# Patient Record
Sex: Female | Born: 1990
Health system: Southern US, Community
[De-identification: ages and names within clinical notes are randomized; demographics above are authoritative.]

## PROBLEM LIST (undated history)

## (undated) DIAGNOSIS — F32A Depression, unspecified: Secondary | ICD-10-CM

## (undated) DIAGNOSIS — R7303 Prediabetes: Secondary | ICD-10-CM

## (undated) DIAGNOSIS — N979 Female infertility, unspecified: Secondary | ICD-10-CM

## (undated) DIAGNOSIS — F329 Major depressive disorder, single episode, unspecified: Secondary | ICD-10-CM

## (undated) DIAGNOSIS — E282 Polycystic ovarian syndrome: Secondary | ICD-10-CM

## (undated) DIAGNOSIS — M549 Dorsalgia, unspecified: Secondary | ICD-10-CM

## (undated) DIAGNOSIS — E079 Disorder of thyroid, unspecified: Secondary | ICD-10-CM

## (undated) DIAGNOSIS — E039 Hypothyroidism, unspecified: Secondary | ICD-10-CM

## (undated) DIAGNOSIS — J45909 Unspecified asthma, uncomplicated: Secondary | ICD-10-CM

## (undated) DIAGNOSIS — Z8619 Personal history of other infectious and parasitic diseases: Secondary | ICD-10-CM

## (undated) DIAGNOSIS — K589 Irritable bowel syndrome without diarrhea: Secondary | ICD-10-CM

## (undated) DIAGNOSIS — F419 Anxiety disorder, unspecified: Secondary | ICD-10-CM

## (undated) HISTORY — DX: Irritable bowel syndrome, unspecified: K58.9

## (undated) HISTORY — DX: Disorder of thyroid, unspecified: E07.9

## (undated) HISTORY — DX: Depression, unspecified: F32.A

## (undated) HISTORY — PX: PLANTAR FASCIA RELEASE: SHX2239

## (undated) HISTORY — DX: Prediabetes: R73.03

## (undated) HISTORY — DX: Unspecified asthma, uncomplicated: J45.909

## (undated) HISTORY — DX: Hypothyroidism, unspecified: E03.9

## (undated) HISTORY — DX: Dorsalgia, unspecified: M54.9

## (undated) HISTORY — DX: Polycystic ovarian syndrome: E28.2

## (undated) HISTORY — DX: Personal history of other infectious and parasitic diseases: Z86.19

## (undated) HISTORY — DX: Female infertility, unspecified: N97.9

## (undated) HISTORY — DX: Anxiety disorder, unspecified: F41.9

---
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
## 2009-11-09 ENCOUNTER — Emergency Department (HOSPITAL_COMMUNITY): Admission: EM | Admit: 2009-11-09 | Discharge: 2009-11-09 | Payer: Self-pay | Admitting: Family Medicine

## 2010-08-05 ENCOUNTER — Encounter: Payer: Self-pay | Admitting: Internal Medicine

## 2010-08-05 ENCOUNTER — Ambulatory Visit (INDEPENDENT_AMBULATORY_CARE_PROVIDER_SITE_OTHER): Payer: Managed Care, Other (non HMO) | Admitting: Internal Medicine

## 2010-08-05 VITALS — BP 120/80 | HR 66 | Ht 69.75 in | Wt 277.0 lb

## 2010-08-05 DIAGNOSIS — R5381 Other malaise: Secondary | ICD-10-CM

## 2010-08-05 DIAGNOSIS — R5383 Other fatigue: Secondary | ICD-10-CM | POA: Insufficient documentation

## 2010-08-05 DIAGNOSIS — E669 Obesity, unspecified: Secondary | ICD-10-CM | POA: Insufficient documentation

## 2010-08-05 DIAGNOSIS — Z113 Encounter for screening for infections with a predominantly sexual mode of transmission: Secondary | ICD-10-CM | POA: Insufficient documentation

## 2010-08-05 DIAGNOSIS — E039 Hypothyroidism, unspecified: Secondary | ICD-10-CM

## 2010-08-05 DIAGNOSIS — E079 Disorder of thyroid, unspecified: Secondary | ICD-10-CM | POA: Insufficient documentation

## 2010-08-05 DIAGNOSIS — Z23 Encounter for immunization: Secondary | ICD-10-CM

## 2010-08-05 LAB — CBC WITH DIFFERENTIAL/PLATELET
Basophils Absolute: 0 10*3/uL (ref 0.0–0.1)
HCT: 43.4 % (ref 36.0–46.0)
Lymphs Abs: 2.9 10*3/uL (ref 0.7–4.0)
MCV: 90.8 fl (ref 78.0–100.0)
Monocytes Absolute: 0.6 10*3/uL (ref 0.1–1.0)
Platelets: 290 10*3/uL (ref 150.0–400.0)
RDW: 13.1 % (ref 11.5–14.6)

## 2010-08-05 LAB — HEPATIC FUNCTION PANEL
ALT: 25 U/L (ref 0–35)
Alkaline Phosphatase: 62 U/L (ref 39–117)
Bilirubin, Direct: 0.1 mg/dL (ref 0.0–0.3)
Total Protein: 7.5 g/dL (ref 6.0–8.3)

## 2010-08-05 LAB — TSH: TSH: 7.34 u[IU]/mL — ABNORMAL HIGH (ref 0.35–5.50)

## 2010-08-05 LAB — BASIC METABOLIC PANEL
BUN: 9 mg/dL (ref 6–23)
Creatinine, Ser: 0.8 mg/dL (ref 0.4–1.2)
GFR: 95.78 mL/min (ref >60.00)

## 2010-08-05 LAB — LIPID PANEL
Cholesterol: 157 mg/dL (ref 0–200)
LDL Cholesterol: 101 mg/dL — ABNORMAL HIGH (ref 0–99)

## 2010-08-05 NOTE — Patient Instructions (Addendum)
Will notify you  of labs when available. Then plan follow up and  Med dosing.  Consider weight watchers type intervention. Avoid eating out and sugar drinks   Some caffiene ok.

## 2010-08-05 NOTE — Progress Notes (Signed)
  Subjective:    Patient ID: Sara Wallace, female    DOB: [birth year], 20 y.o.   MRN: [id number]  HPI Patient comes in today for a first-time visit. She is here to establish and followup for fatigue thyroid problem as her previous primary care physician has moved. Onset of thyroid condition in 2009 NS TOOK  Up to 6 months ago and then forgot   To take.    Saw Dr Larina Bras about 2010  . Initial   Dx was tired and weight gain.  Tired and irritable. when goes off.  Gyne Silva  No problems.   Last blood test for thyroid  Was about a year ago.   Not too bad. Was on 50 mcg  Always struggles with weight asks about Optifast flier that she is seen. Has not gone to Weight Watchers because of the money investments.  Graduated high school   Attends [employer]  Nursing  12 hours  To go for  bsn.    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   Sleep 9 hours.   No wakenings.    Recent   no t rested.   No  Snoring sleep apnea. Some activity.      Review of Systems Negative for hearing vision chest pain shortness of breath unusual bleeding depression panic. Nausea vomiting diarrhea rest negative or as per history of present illness    Objective:   Physical Exam Physical Exam: Vital signs reviewed WUJ:WJXB is a well-developed well-nourished alert cooperative  white female who appears her stated age in no acute distress.  HEENT: normocephalic  traumatic , Eyes: PERRL EOM's full, conjunctiva clear, Nares: paten,t no deformity discharge or tenderness., Ears: no deformity EAC's clear TMs with normal landmarks. Mouth: clear OP, no lesions, edema.  Moist mucous membranes. Dentition in adequate repair. NECK: supple without masses, thyromegaly or bruits. CHEST/PULM:  Clear to auscultation and percussion breath sounds equal no wheeze , rales or rhonchi. No chest wall deformities or tenderness. CV: PMI is nondisplaced, S1 S2 no gallops, murmurs, rubs. Peripheral pulses are full without delay.No JVD .  ABDOMEN: Bowel sounds normal  nontender  No guard or rebound, no hepato splenomegal no CVA tenderness.  No hernia. Extremtities:  No clubbing cyanosis or edema, no acute joint swelling or redness no focal atrophy NEURO:  Oriented x3, cranial nerves 3-12 appear to be intact, no obvious focal weakness,gait within normal limits no abnormal reflexes or asymmetrical SKIN: No acute rashes normal turgor, color, no bruising or petechiae.  tatoo and piercing s PSYCH: Oriented, good eye contact, no obvious depression anxiety, cognition and judgment appear normal.     Assessment & Plan:  Hypothyroid dx  Off med for 6 months   Disc compliance   adherance  Obesity Fatigue   Fam hx of dm  father No obvious osa    Condition counseling about weight control nutrition and healthy lifestyle. We'll get laboratory studies followup is appropriate but lifestyle intervention may be the best that for her. Caution with diet such as Optifast as it is very low-calorie cost money in the maintenance is often unsuccessful. Would suggest more of a lifestyle change plans such as Weight Watchers.

## 2010-08-06 LAB — RPR

## 2010-08-07 ENCOUNTER — Ambulatory Visit: Payer: Self-pay | Admitting: Internal Medicine

## 2010-08-10 ENCOUNTER — Telehealth: Payer: Self-pay | Admitting: *Deleted

## 2010-08-10 DIAGNOSIS — E039 Hypothyroidism, unspecified: Secondary | ICD-10-CM

## 2010-08-10 NOTE — Telephone Encounter (Signed)
Pt aware of results. Order placed in epic for tsh.

## 2010-08-10 NOTE — Telephone Encounter (Signed)
Message copied by Romualdo Bolk on Mon Aug 10, 2010  1:35 PM ------      Message from: [facility], [place] [facility]      Created: Sun Aug 09, 2010  5:24 PM       Tell patient that her thyroid shows she does need the medication. She is mildly  Hypothyroid.But has thyroiditis.       Since she has the  50 mcg of synthroid with her take this 1 po qd every day as we discussed .( get a pill box ) call in rx    If she needs this.      Lipid shows low HDl good  Cholesterol.   Losing weight and exercise will help this.            Check tsh in 2 months and then ROV .Marland KitchenMarland Kitchen

## 2011-10-06 ENCOUNTER — Telehealth: Payer: Self-pay | Admitting: Internal Medicine

## 2011-10-06 NOTE — Telephone Encounter (Signed)
OK with me.

## 2011-10-06 NOTE — Telephone Encounter (Signed)
Pt called and is req to change pcps from Dr Fabian Sharp to Dr Caryl Never because pts family goes to Dr Caryl Never. Pls advise.

## 2011-10-06 NOTE — Telephone Encounter (Signed)
Ok with me if ok with Dr B Have only seen her once

## 2011-10-07 NOTE — Telephone Encounter (Signed)
Called and lft pt vm, that both doctors have agreed to change of pcp. Waiting on call back to sch ov with Dr Caryl Never.

## 2011-10-08 NOTE — Telephone Encounter (Signed)
Pt called and has schd ov to est with Dr Caryl Never on 11/04/11, as noted. Ok per both doctors.

## 2011-10-29 LAB — HM PAP SMEAR: HM Pap smear: NORMAL

## 2011-11-04 ENCOUNTER — Encounter: Payer: Self-pay | Admitting: Family Medicine

## 2011-11-04 ENCOUNTER — Ambulatory Visit (INDEPENDENT_AMBULATORY_CARE_PROVIDER_SITE_OTHER): Payer: Managed Care, Other (non HMO) | Admitting: Family Medicine

## 2011-11-04 VITALS — BP 120/88 | HR 80 | Temp 98.4°F | Resp 12 | Ht 70.5 in | Wt 277.0 lb

## 2011-11-04 DIAGNOSIS — J4540 Moderate persistent asthma, uncomplicated: Secondary | ICD-10-CM

## 2011-11-04 DIAGNOSIS — E039 Hypothyroidism, unspecified: Secondary | ICD-10-CM

## 2011-11-04 DIAGNOSIS — J45909 Unspecified asthma, uncomplicated: Secondary | ICD-10-CM

## 2011-11-04 NOTE — Progress Notes (Signed)
  Subjective:    Patient ID: Sara Wallace, female    DOB: 28-Jan-1991, 21 y.o.   MRN: [id number]  HPI  Patient is seen to establish care with me. She has history of obesity, hypothyroidism and had some gradual weight gain over the past few years. No consistent exercise. She has hypothyroidism but has not taken her thyroid medication over one year. She denies any constipation or cold intolerance.  She continues to smoke half-pack cigarettes per day. Plans get flu vaccine at work. She has almost daily coughing and wheezing especially at night. She has indoor dog. Rescue inhaler usually helps. She's never been on steroid inhaler. She's had almost daily cough for several months. No dyspnea with exertion. No recent fever. No productive cough.  Past Medical History  Diagnosis Date  . Thyroid disease   . Hx of varicella    No past surgical history on file.  reports that she has been smoking.  She does not have any smokeless tobacco history on file. She reports that she does not drink alcohol or use illicit drugs. family history includes Arthritis in her mother; Diabetes in her father; Fibromyalgia in her mother; Hypertension in her mother; and Scoliosis in her mother. No Known Allergies    Review of Systems  Constitutional: Positive for fatigue and unexpected weight change. Negative for fever and chills.  Respiratory: Positive for cough and wheezing.   Gastrointestinal: Negative for abdominal pain.  Genitourinary: Negative for dysuria.  Neurological: Negative for dizziness and headaches.  Hematological: Negative for adenopathy.       Objective:   Physical Exam  Constitutional: She appears well-developed and well-nourished.  Neck: Neck supple. No thyromegaly present.  Cardiovascular: Normal rate and regular rhythm.   Pulmonary/Chest: Effort normal and breath sounds normal. No respiratory distress. She has no wheezes. She has no rales.  Musculoskeletal: She exhibits no edema.    Lymphadenopathy:    She has no cervical adenopathy.  Skin: No rash noted.          Assessment & Plan:  #1 hypothyroidism. Poor compliance with therapy. Recent progressive fatigue and weight gain probably related not being on medication. Recheck TSH. Will very likely need to be on thyroid replacement again and we explained this will be lifelong  #2 wheezing. Suspect moderate persistent asthma. Almost daily wheezing. Starting to interfere more with activities. Start Pulmicort 180 mcg twice daily and rinse mouth after use. Continue as needed rescue inhaler. Smoking cessation discussed. Flu vaccine through work.

## 2011-11-04 NOTE — Patient Instructions (Addendum)
Asthma, Adult Asthma is caused by narrowing of the air passages in the lungs. It may be triggered by pollen, dust, animal dander, molds, some foods, respiratory infections, exposure to smoke, exercise, emotional stress or other allergens (things that cause allergic reactions or allergies). Repeat attacks are common. HOME CARE INSTRUCTIONS   Use prescription medications as ordered by your caregiver.   Avoid pollen, dust, animal dander, molds, smoke and other things that cause attacks at home and at work.   You may have fewer attacks if you decrease dust in your home. Electrostatic air cleaners may help.   It may help to replace your pillows or mattress with materials less likely to cause allergies.   Talk to your caregiver about an action plan for managing asthma attacks at home, including, the use of a peak flow meter which measures the severity of your asthma attack. An action plan can help minimize or stop the attack without having to seek medical care.   If you are not on a fluid restriction, drink 8 to 10 glasses of water each day.   Always have a plan prepared for seeking medical attention, including, calling your physician, accessing local emergency care, and calling 911 (in the [place] for a severe attack.   Discuss possible exercise routines with your caregiver.   If animal dander is the cause of asthma, you may need to get rid of pets.  SEEK MEDICAL CARE IF:   You have wheezing and shortness of breath even if taking medicine to prevent attacks.   You have muscle aches, chest pain or thickening of sputum.   Your sputum changes from clear or white to yellow, green, gray, or bloody.   You have any problems that may be related to the medicine you are taking (such as a rash, itching, swelling or trouble breathing).  SEEK IMMEDIATE MEDICAL CARE IF:   Your usual medicines do not stop your wheezing or there is increased coughing and/or shortness of breath.   You have increased  difficulty breathing.   You have a fever.  MAKE SURE YOU:   Understand these instructions.   Will watch your condition.   Will get help right away if you are not doing well or get worse.  Document Released: 02/01/2005 Document Revised: 01/21/2011 Document Reviewed: 09/20/2007 [facility] Patient Information 2012 [place], [place].  Pulmicort one puff twice daily

## 2011-11-05 ENCOUNTER — Other Ambulatory Visit: Payer: Self-pay | Admitting: *Deleted

## 2011-11-05 DIAGNOSIS — R7989 Other specified abnormal findings of blood chemistry: Secondary | ICD-10-CM

## 2011-11-05 MED ORDER — LEVOTHYROXINE SODIUM 25 MCG PO TABS: 25.0000 ug | ORAL_TABLET | Freq: Every day | ORAL | Status: DC

## 2011-11-05 NOTE — Progress Notes (Signed)
Quick Note:  Pt informed on VM ______ 

## 2012-02-07 ENCOUNTER — Ambulatory Visit (INDEPENDENT_AMBULATORY_CARE_PROVIDER_SITE_OTHER): Payer: Managed Care, Other (non HMO) | Admitting: Family Medicine

## 2012-02-07 ENCOUNTER — Encounter: Payer: Self-pay | Admitting: Family Medicine

## 2012-02-07 VITALS — BP 110/80 | Temp 98.0°F | Wt 282.0 lb

## 2012-02-07 DIAGNOSIS — J4540 Moderate persistent asthma, uncomplicated: Secondary | ICD-10-CM

## 2012-02-07 DIAGNOSIS — E039 Hypothyroidism, unspecified: Secondary | ICD-10-CM

## 2012-02-07 DIAGNOSIS — J45909 Unspecified asthma, uncomplicated: Secondary | ICD-10-CM

## 2012-02-07 MED ORDER — BECLOMETHASONE DIPROPIONATE 80 MCG/ACT IN AERS: 1.0000 | INHALATION_SPRAY | Freq: Two times a day (BID) | RESPIRATORY_TRACT | Status: DC

## 2012-02-07 NOTE — Progress Notes (Signed)
  Subjective:    Patient ID: Sara Wallace, female    DOB: [birth year], 21 y.o.   MRN: [id number]  HPI  Here for the following:  Hypothyroidism. Last visit not taking medication. She's back on levothyroxin 25 mcg daily and taking regularly. Has not noted any improvement in energy. Denies constipation or alopecia. No cold intolerance.  History of asthma. Probable moderate persistent. She states she developed dizziness and headaches with Pulmicort and stopped taking after 2 weeks. Most weeks, she averages wheezing about every other day. Still smoking but trying to quit. She has taper back to less than one half pack per day. She is using electronic cigarettes in attempt to try to taper down nicotine.  Past Medical History  Diagnosis Date  . Thyroid disease   . Hx of varicella    No past surgical history on file.  reports that she has been smoking.  She does not have any smokeless tobacco history on file. She reports that she does not drink alcohol or use illicit drugs. family history includes Arthritis in her mother; Diabetes in her father; Fibromyalgia in her mother; Hypertension in her mother; and Scoliosis in her mother. No Known Allergies    Review of Systems  Constitutional: Negative for fever and chills.  Respiratory: Positive for cough and wheezing.   Cardiovascular: Negative for chest pain.       Objective:   Physical Exam  Constitutional: She appears well-developed and well-nourished.  HENT:  Right Ear: External ear normal.  Left Ear: External ear normal.  Mouth/Throat: Oropharynx is clear and moist.  Neck: Neck supple. No thyromegaly present.  Cardiovascular: Normal rate and regular rhythm.   Pulmonary/Chest:       Patient has some faint wheezes. No rales.          Assessment & Plan:  #1 hypothyroidism. Recheck TSH #2 asthma. Probably moderate persistent. Trial Qvar 80 mcg one puff twice daily with instructions given for use including rinsing mouth after use.  Touch base in 2-3 weeks if no additional improvements

## 2012-02-07 NOTE — Patient Instructions (Addendum)

## 2012-02-08 ENCOUNTER — Other Ambulatory Visit: Payer: Self-pay | Admitting: *Deleted

## 2012-02-08 DIAGNOSIS — E039 Hypothyroidism, unspecified: Secondary | ICD-10-CM

## 2012-02-08 MED ORDER — LEVOTHYROXINE SODIUM 50 MCG PO TABS: 50.0000 ug | ORAL_TABLET | Freq: Every day | ORAL | Status: DC

## 2012-02-08 NOTE — Progress Notes (Signed)
Quick Note:  Pt informed ______ 

## 2012-06-12 ENCOUNTER — Encounter: Payer: Self-pay | Admitting: Family Medicine

## 2012-06-12 ENCOUNTER — Telehealth: Payer: Self-pay | Admitting: Nurse Practitioner

## 2012-06-12 ENCOUNTER — Ambulatory Visit (INDEPENDENT_AMBULATORY_CARE_PROVIDER_SITE_OTHER): Payer: Managed Care, Other (non HMO) | Admitting: Family Medicine

## 2012-06-12 VITALS — BP 110/78 | HR 99 | Temp 98.6°F | Wt 272.0 lb

## 2012-06-12 DIAGNOSIS — J45901 Unspecified asthma with (acute) exacerbation: Secondary | ICD-10-CM

## 2012-06-12 DIAGNOSIS — J329 Chronic sinusitis, unspecified: Secondary | ICD-10-CM

## 2012-06-12 DIAGNOSIS — J209 Acute bronchitis, unspecified: Secondary | ICD-10-CM

## 2012-06-12 MED ORDER — ALBUTEROL SULFATE HFA 108 (90 BASE) MCG/ACT IN AERS: 2.0000 | INHALATION_SPRAY | Freq: Four times a day (QID) | RESPIRATORY_TRACT | Status: DC | PRN

## 2012-06-12 MED ORDER — PREDNISONE 20 MG PO TABS: ORAL_TABLET | ORAL | Status: DC

## 2012-06-12 MED ORDER — BECLOMETHASONE DIPROPIONATE 80 MCG/ACT IN AERS: 1.0000 | INHALATION_SPRAY | Freq: Two times a day (BID) | RESPIRATORY_TRACT | Status: DC

## 2012-06-12 MED ORDER — DOXYCYCLINE HYCLATE 100 MG PO TABS: 100.0000 mg | ORAL_TABLET | Freq: Two times a day (BID) | ORAL | Status: DC

## 2012-06-12 NOTE — Telephone Encounter (Signed)
Pt has appt scheduled with her family dr

## 2012-06-12 NOTE — Progress Notes (Signed)
Chief Complaint  Patient presents with  . Cough    congestion, headache, runny nose, difficult breathing due to congestion, ears stopped up, , teeth pai    HPI:  Acute visit for sinus congestion: -started: 3 days ago -symptoms:nasal congestion and cold like symptoms for about a week, sore throat, cough, then developed some wheezing and mild SOB the last few days, also has some sinus pain and pressure and ear fullness -denies:fever, SOB, NVD, tooth pain,  -has tried: albuterol - helped, OTC cold medications -sick contacts: none known -Hx of: asthma, started on qvar last visit with PCP - reports she is supposed to take qvar but hasn't been taking this, she also doesn't have any albuterol - took some of her sig other albuterol and this helped -reports has never been hospitalized for her asthma - but did take abx and prednisone once about one year ago for asthmatic bronchitis -smokes half pack per day -at baseline has asthma symptoms - coughing, wheezing on a nightly basis No chance of pregnancy per pt, FDLMP 2 weeks ago   ROS: See pertinent positives and negatives per HPI.  Past Medical History  Diagnosis Date  . Thyroid disease   . Hx of varicella     Family History  Problem Relation Age of Onset  . Arthritis Mother   . Fibromyalgia Mother   . Scoliosis Mother   . Hypertension Mother   . Diabetes Father     History   Social History  . Marital Status: Single    Spouse Name: N/A    Number of Children: N/A  . Years of Education: N/A   Social History Main Topics  . Smoking status: Current Every Day Smoker -- 0.50 packs/day for 6 years  . Smokeless tobacco: None  . Alcohol Use: No  . Drug Use: No  . Sexually Active: None   Other Topics Concern  . 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  [place]   Neg ets firearms   Has Bf 1 partener    Current outpatient  prescriptions:Etonogestrel (IMPLANON) 68 MG IMPL, Inject into the skin.  , Disp: , Rfl: ;  levothyroxine (SYNTHROID, LEVOTHROID) 50 MCG tablet, Take 1 tablet (50 mcg total) by mouth daily., Disp: 90 tablet, Rfl: 3;  albuterol (PROVENTIL HFA;VENTOLIN HFA) 108 (90 BASE) MCG/ACT inhaler, Inhale 2 puffs into the lungs every 6 (six) hours as needed for wheezing., Disp: 1 Inhaler, Rfl: 2 beclomethasone (QVAR) 80 MCG/ACT inhaler, Inhale 1 puff into the lungs 2 (two) times daily., Disp: 1 Inhaler, Rfl: 0;  doxycycline (VIBRA-TABS) 100 MG tablet, Take 1 tablet (100 mg total) by mouth 2 (two) times daily., Disp: 20 tablet, Rfl: 0;  predniSONE (DELTASONE) 20 MG tablet, 60mg  (3 tablets) for 3 days, then 2 tablets for 3 days, then 1 tablet for 3 days, then stop, Disp: 18 tablet, Rfl: 0  EXAM:  Filed Vitals:   06/12/12 1354  BP: 110/78  Pulse: 99  Temp: 98.6 F (37 C)    Body mass index is 38.46 kg/(m^2).  GENERAL: vitals reviewed and listed above, alert, oriented, appears well hydrated and in no acute distress  HEENT: atraumatic, conjunttiva clear, no obvious abnormalities on inspection of external nose and ears, normal appearance of ear canals and TMs, clear nasal congestion, mild post oropharyngeal erythema with PND, no tonsillar edema or  exudate, no sinus TTP  NECK: no obvious masses on inspection  LUNGS: few scattered wheezes, no resp distress  CV: HRRR, no peripheral edema  MS: moves all extremities without noticeable abnormality  PSYCH: pleasant and cooperative, no obvious depression or anxiety  ASSESSMENT AND PLAN:  Discussed the following assessment and plan:  Acute bronchitis - Plan: doxycycline (VIBRA-TABS) 100 MG tablet, predniSONE (DELTASONE) 20 MG tablet  Sinusitis - Plan: doxycycline (VIBRA-TABS) 100 MG tablet, predniSONE (DELTASONE) 20 MG tablet  Asthma with acute exacerbation - Plan: predniSONE (DELTASONE) 20 MG tablet, beclomethasone (QVAR) 80 MCG/ACT inhaler, albuterol  (PROVENTIL HFA;VENTOLIN HFA) 108 (90 BASE) MCG/ACT inhaler  -hx likely moderate persistent asthma per symptom report/review of chart - not taking medications that were recommeneded -no with acute URI with sinus pain and bronchial involvement with wheezing, cough, mild SOB -no resp distress, O2 sats good -advised per orders - risks discussed -advised to quit smoking -close follow up with PCP in 2-4 weeks or sooner if not improving, return and emergency precautions discussed -Patient advised to return or notify a doctor immediately if symptoms worsen or persist or new concerns arise.  Patient Instructions  Acute Bronchitis You have acute bronchitis. This means you have a chest cold. The airways in your lungs are red and sore (inflamed). Acute means it is sudden onset.  CAUSES Bronchitis is most often caused by the same virus that causes a cold. SYMPTOMS   Body aches.  Chest congestion.  Chills.  Cough.  Fever.  Shortness of breath.  Sore throat. TREATMENT  Acute bronchitis is usually treated with rest, fluids, and medicines for relief of fever or cough. Most symptoms should go away after a few days or a week. Increased fluids may help thin your secretions and will prevent dehydration. Your caregiver may give you an inhaler to improve your symptoms. The inhaler reduces shortness of breath and helps control cough. You can take over-the-counter pain relievers or cough medicine to decrease coughing, pain, or fever. A cool-air vaporizer may help thin bronchial secretions and make it easier to clear your chest. Antibiotics are usually not needed but can be prescribed if you smoke, are seriously ill, have chronic lung problems, are elderly, or you are at higher risk for developing complications.Allergies and asthma can make bronchitis worse. Repeated episodes of bronchitis may cause longstanding lung problems. Avoid smoking and secondhand smoke.Exposure to cigarette smoke or irritating  chemicals will make bronchitis worse. If you are a cigarette smoker, consider using nicotine gum or skin patches to help control withdrawal symptoms. Quitting smoking will help your lungs heal faster. Recovery from bronchitis is often slow, but you should start feeling better after 2 to 3 days. Cough from bronchitis frequently lasts for 3 to 4 weeks. To prevent another bout of acute bronchitis:  Quit smoking.  Wash your hands frequently to get rid of viruses or use a hand sanitizer.  Avoid other people with cold or virus symptoms.  Try not to touch your hands to your mouth, nose, or eyes. SEEK IMMEDIATE MEDICAL CARE IF:  You develop increased fever, chills, or chest pain.  You have severe shortness of breath or bloody sputum.  You develop dehydration, fainting, repeated vomiting, or a severe headache.  You have no improvement after 1 week of treatment or you get worse. MAKE SURE YOU:   Understand these instructions.  Will watch your condition.  Will get help right away if you are not doing well or get worse. Document Released: 03/11/2004 Document Revised:  04/26/2011 Document Reviewed: 05/27/2010 [facility] Patient Information [address] [place], [place]

## 2012-06-12 NOTE — Patient Instructions (Signed)

## 2012-06-28 ENCOUNTER — Encounter: Payer: Self-pay | Admitting: Family Medicine

## 2012-06-28 ENCOUNTER — Ambulatory Visit (INDEPENDENT_AMBULATORY_CARE_PROVIDER_SITE_OTHER): Payer: Managed Care, Other (non HMO) | Admitting: Family Medicine

## 2012-06-28 VITALS — BP 110/70 | Temp 98.6°F | Wt 272.0 lb

## 2012-06-28 DIAGNOSIS — J45909 Unspecified asthma, uncomplicated: Secondary | ICD-10-CM

## 2012-06-28 DIAGNOSIS — E039 Hypothyroidism, unspecified: Secondary | ICD-10-CM

## 2012-06-28 DIAGNOSIS — J4540 Moderate persistent asthma, uncomplicated: Secondary | ICD-10-CM

## 2012-06-28 NOTE — Patient Instructions (Addendum)

## 2012-06-28 NOTE — Progress Notes (Signed)
  Subjective:    Patient ID: Sara Wallace, female    DOB: 1990/04/22, 22 y.o.   MRN: [id number]  HPI Patient seen for followup regarding asthma and hypothyroidism She had acute illness in late April. Treated with antibiotics and oral prednisone. She was started back on her steroid inhaler. We had started her on steroid inhaler back in December and she did notice improvement but stopped this on her own. She's had frequent episodes of wheezing especially at night and we suspect at least moderate persistent asthma.  She is seeing great improvement since starting back this inhaler. She's not having to use albuterol very often at all.  Hypothyroidism treated with levothyroxine 50 mcg daily. TSH was elevated back in December of we titrated her from 25 to 50 mcg then. She's had no followup since that time  Still smoking but is trying to quit.  Past Medical History  Diagnosis Date  . Thyroid disease   . Hx of varicella    No past surgical history on file.  reports that she has been smoking.  She does not have any smokeless tobacco history on file. She reports that she does not drink alcohol or use illicit drugs. family history includes Arthritis in her mother; Diabetes in her father; Fibromyalgia in her mother; Hypertension in her mother; and Scoliosis in her mother. No Known Allergies    Review of Systems  Constitutional: Negative for fever, chills, appetite change, fatigue and unexpected weight change.  Respiratory: Negative for cough, shortness of breath and wheezing.   Cardiovascular: Negative for chest pain.  Gastrointestinal: Negative for constipation.       Objective:   Physical Exam  Constitutional: She appears well-developed and well-nourished.  Neck: Neck supple. No thyromegaly present.  Cardiovascular: Normal rate and regular rhythm.   Pulmonary/Chest: Effort normal and breath sounds normal. No respiratory distress. She has no wheezes. She has no rales.   Musculoskeletal: She exhibits no edema.          Assessment & Plan:  #1 moderate persistent asthma. Continue steroid inhaler with Qvar 80 mcg 2 puffs twice daily. Rinse mouth after use. We discussed smoking cessation #2 hypothyroidism. Repeat TSH and adjust medication accordingly

## 2012-06-29 NOTE — Progress Notes (Signed)
Quick Note:  Pt informed on home VM ______ 

## 2012-10-30 ENCOUNTER — Ambulatory Visit (INDEPENDENT_AMBULATORY_CARE_PROVIDER_SITE_OTHER): Payer: Managed Care, Other (non HMO) | Admitting: Family Medicine

## 2012-10-30 ENCOUNTER — Encounter: Payer: Self-pay | Admitting: Family Medicine

## 2012-10-30 VITALS — BP 109/75 | HR 77 | Temp 97.2°F | Resp 16 | Ht 70.0 in | Wt 283.4 lb

## 2012-10-30 DIAGNOSIS — Z0000 Encounter for general adult medical examination without abnormal findings: Secondary | ICD-10-CM

## 2012-10-30 DIAGNOSIS — Z23 Encounter for immunization: Secondary | ICD-10-CM

## 2012-10-30 DIAGNOSIS — Z020 Encounter for examination for admission to educational institution: Secondary | ICD-10-CM

## 2012-10-30 NOTE — Patient Instructions (Signed)
Calorie Counting Diet A calorie counting diet requires you to eat the number of calories that are right for you in a day. Calories are the measurement of how much energy you get from the food you eat. Eating the right amount of calories is important for staying at a healthy weight. If you eat too many calories, your body will store them as fat and you may gain weight. If you eat too few calories, you may lose weight. Counting the number of calories you eat during a day will help you know if you are eating the right amount. A Registered Dietitian can determine how many calories you need in a day. The amount of calories needed varies from person to person. If your goal is to lose weight, you will need to eat fewer calories. Losing weight can benefit you if you are overweight or have health problems such as heart disease, high blood pressure, or diabetes. If your goal is to gain weight, you will need to eat more calories. Gaining weight may be necessary if you have a certain health problem that causes your body to need more energy. TIPS Whether you are increasing or decreasing the number of calories you eat during a day, it may be hard to get used to changes in what you eat and drink. The following are tips to help you keep track of the number of calories you eat.  Measure foods at home with measuring cups. This helps you know the amount of food and number of calories you are eating.  Restaurants often serve food in amounts that are larger than 1 serving. While eating out, estimate how many servings of a food you are given. For example, a serving of cooked rice is  cup or about the size of half of a fist. Knowing serving sizes will help you be aware of how much food you are eating at restaurants.  Ask for smaller portion sizes or child-size portions at restaurants.  Plan to eat half of a meal at a restaurant. Take the rest home or share the other half with a friend.  Read the Nutrition Facts panel on  food labels for calorie content and serving size. You can find out how many servings are in a package, the size of a serving, and the number of calories each serving has.  For example, a package might contain 3 cookies. The Nutrition Facts panel on that package says that 1 serving is 1 cookie. Below that, it will say there are 3 servings in the container. The calories section of the Nutrition Facts label says there are 90 calories. This means there are 90 calories in 1 cookie (1 serving). If you eat 1 cookie you have eaten 90 calories. If you eat all 3 cookies, you have eaten 270 calories (3 servings x 90 calories = 270 calories). The list below tells you how big or small some common portion sizes are.  1 oz.........4 stacked dice.  3 oz.........Deck of cards.  1 tsp........Tip of little finger.  1 tbs........Thumb.  2 tbs........Golf ball.   cup.......Half of a fist.  1 cup........A fist. KEEP A FOOD LOG Write down every food item you eat, the amount you eat, and the number of calories in each food you eat during the day. At the end of the day, you can add up the total number of calories you have eaten. It may help to keep a list like the one below. Find out the calorie information by reading the   Nutrition Facts panel on food labels. Breakfast  Bran cereal (1 cup, 110 calories).  Fat-free milk ( cup, 45 calories). Snack  Apple (1 medium, 80 calories). Lunch  Spinach (1 cup, 20 calories).  Tomato ( medium, 20 calories).  Chicken breast strips (3 oz, 165 calories).  Shredded cheddar cheese ( cup, 110 calories).  Light Italian dressing (2 tbs, 60 calories).  Whole-wheat bread (1 slice, 80 calories).  Tub margarine (1 tsp, 35 calories).  Vegetable soup (1 cup, 160 calories). Dinner  Pork chop (3 oz, 190 calories).  Brown rice (1 cup, 215 calories).  Steamed broccoli ( cup, 20 calories).  Strawberries (1  cup, 65 calories).  Whipped cream (1 tbs, 50  calories). Daily Calorie Total: 1425 Document Released: 02/01/2005 Document Revised: 04/26/2011 Document Reviewed: 07/29/2006 ExitCare Patient Information 2014 [employer].  

## 2012-10-30 NOTE — Progress Notes (Signed)
  Subjective:    Patient ID: Sara Wallace, female    DOB: November 25, 1990, 22 y.o.   MRN: [id number]  HPI This 22 y.o. female presents for evaluation of school PE.  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  She is taking QVAR for asthma and this is controlled.  She is taking levothyroxine for Hypothyroidism.  She has no acute complaints   Review of Systems No chest pain, SOB, HA, dizziness, vision change, N/V, diarrhea, constipation, dysuria, urinary urgency or frequency, myalgias, arthralgias or rash.     Objective:   Physical Exam Vital signs noted  Well developed well nourished female.  HEENT - Head atraumatic Normocephalic                Eyes - PERRLA, Conjuctiva - clear Sclera- Clear EOMI                Ears - EAC's Wnl TM's Wnl Gross Hearing WNL                Nose - Nares patent                 Throat - oropharanx wnl Respiratory - Lungs CTA bilateral Cardiac - RRR S1 and S2 without murmur GI - Abdomen soft Nontender and bowel sounds active x 4 Extremities - No edema. Neuro - Grossly intact.       Assessment & Plan:  School physical exam - Plan: Measles/Mumps/Rubella Immunity, Varicella zoster antibody, IgG, Tdap vaccine greater than or equal to 7yo IM, TB Skin Test Cleared for school and forms given to patient.

## 2012-10-31 LAB — MEASLES/MUMPS/RUBELLA IMMUNITY
MUMPS ABS, IGG: 109 AU/mL (ref >10.9)
RUBEOLA AB, IGG: >300 AU/mL (ref >29.9)
Rubella Antibodies, IGG: 12.5 index (ref >0.99)

## 2012-10-31 LAB — VARICELLA ZOSTER ANTIBODY, IGG: Varicella zoster IgG: 1411 index (ref >165)

## 2012-11-01 LAB — TB SKIN TEST
Induration: 0 mm
TB Skin Test: NEGATIVE

## 2012-11-03 NOTE — Progress Notes (Signed)
Pt notified  Form completed

## 2012-11-10 ENCOUNTER — Ambulatory Visit (INDEPENDENT_AMBULATORY_CARE_PROVIDER_SITE_OTHER): Payer: Managed Care, Other (non HMO) | Admitting: *Deleted

## 2012-11-10 DIAGNOSIS — Z111 Encounter for screening for respiratory tuberculosis: Secondary | ICD-10-CM

## 2012-11-10 NOTE — Progress Notes (Signed)
Patient tolerated well.

## 2012-11-14 LAB — TB SKIN TEST: Induration: 0 mm

## 2015-02-19 DIAGNOSIS — J018 Other acute sinusitis: Secondary | ICD-10-CM | POA: Diagnosis not present

## 2015-04-04 ENCOUNTER — Ambulatory Visit (INDEPENDENT_AMBULATORY_CARE_PROVIDER_SITE_OTHER): Payer: 59 | Admitting: Family Medicine

## 2015-04-04 ENCOUNTER — Encounter: Payer: Self-pay | Admitting: Family Medicine

## 2015-04-04 VITALS — BP 140/80 | Temp 98.7°F | Ht 70.0 in | Wt 304.2 lb

## 2015-04-04 DIAGNOSIS — J4540 Moderate persistent asthma, uncomplicated: Secondary | ICD-10-CM

## 2015-04-04 DIAGNOSIS — J329 Chronic sinusitis, unspecified: Secondary | ICD-10-CM

## 2015-04-04 DIAGNOSIS — E038 Other specified hypothyroidism: Secondary | ICD-10-CM | POA: Diagnosis not present

## 2015-04-04 MED ORDER — CLARITHROMYCIN 500 MG PO TABS: 500.0000 mg | ORAL_TABLET | Freq: Two times a day (BID) | ORAL | Status: DC

## 2015-04-04 MED ORDER — BECLOMETHASONE DIPROPIONATE 80 MCG/ACT IN AERS: 2.0000 | INHALATION_SPRAY | Freq: Two times a day (BID) | RESPIRATORY_TRACT | Status: DC

## 2015-04-04 MED ORDER — PREDNISONE 20 MG PO TABS: ORAL_TABLET | ORAL | Status: DC

## 2015-04-04 MED ORDER — ALBUTEROL SULFATE HFA 108 (90 BASE) MCG/ACT IN AERS: 2.0000 | INHALATION_SPRAY | RESPIRATORY_TRACT | Status: DC | PRN

## 2015-04-04 NOTE — Progress Notes (Signed)
   Subjective:    Patient ID: Sara Wallace, female    DOB: Apr 27, 1990, 25 y.o.   MRN: [id number]  Sinusitis This is a new problem. The current episode started in the past 7 days. There has been no fever. The pain is moderate. Associated symptoms include congestion, coughing and headaches. (Muscle aches, wheezing) Past treatments include oral decongestants (inhaler). The treatment provided no relief.   Patient has no other concerns at this time.   Dry cough, not   Some achey    Patient has long-standing history of asthma. Generally flares up just during respiratory infections. Has used Qvar preventive, but has gotten away from using it regularly recently next. Chronic medicines reviewed today with patient  Patient has history of hypothyroidism. On medications for this. No symptoms of high or low thyroid. Compliant with medicine. Medicines reviewed today with patient  Review of Systems  HENT: Positive for congestion.   Respiratory: Positive for cough.   Neurological: Positive for headaches.       Objective:   Physical Exam  Alert mild malaise vitals stable HET moderate nasal congestion frontal transferase normal lungs bilateral wheezes heart regular in rhythm      Assessment & Plan:  Impression 1 post viral rhinosinusitis/bronchitis #2 flare of asthma was second major flare just 2 months plan prednisone taper. Antibiotics prescribed. Symptomatic care discussed renew Qvar at least for next couple months rationale discussed WSL

## 2015-04-17 ENCOUNTER — Telehealth: Payer: Self-pay | Admitting: Family Medicine

## 2015-04-17 MED ORDER — MAGIC MOUTHWASH
ORAL | Status: DC
Start: 2015-04-17 — End: 2015-10-29

## 2015-04-17 NOTE — Telephone Encounter (Signed)
Pt called stating that she was recently on prednisone and now believes that she has thrush. Pt wants to know if something can be called in.    [facility] [place]

## 2015-04-17 NOTE — Telephone Encounter (Signed)
Dukes m mwash 16 oz one tblespoon gargle and spit qid

## 2015-04-17 NOTE — Telephone Encounter (Signed)
Called and spoke with patient and informed her per Dr.Steve Luking- Dukes Magic Mouthwash was sent to [facility] in [place]. Informed patient to gargle and spit 1 tablespoon 4 times a day. Patient verbalized understanding.

## 2015-04-18 ENCOUNTER — Telehealth: Payer: Self-pay | Admitting: Family Medicine

## 2015-04-18 MED ORDER — FLUCONAZOLE 200 MG PO TABS: 200.0000 mg | ORAL_TABLET | Freq: Every day | ORAL | Status: DC

## 2015-04-18 NOTE — Telephone Encounter (Signed)
magic mouthwash SOLN   This was sent in today is not covered under her insurance   Can we resend one that is covered.    [facility] [place]

## 2015-04-18 NOTE — Telephone Encounter (Signed)
Notified patient that med was sent to pharmacy.  

## 2015-04-18 NOTE — Telephone Encounter (Signed)
Diflucan 200 mg daily for seven days

## 2015-05-06 DIAGNOSIS — N979 Female infertility, unspecified: Secondary | ICD-10-CM | POA: Diagnosis not present

## 2015-05-27 ENCOUNTER — Telehealth: Payer: Self-pay | Admitting: *Deleted

## 2015-05-27 DIAGNOSIS — E039 Hypothyroidism, unspecified: Secondary | ICD-10-CM

## 2015-05-27 MED ORDER — LEVOTHYROXINE SODIUM 137 MCG PO CAPS: 137.0000 ug | ORAL_CAPSULE | Freq: Every day | ORAL | Status: DC

## 2015-05-27 NOTE — Telephone Encounter (Signed)
Patient states her gyn told her that her thyroid was off but to contact us regarding adjustment. Patient states she is currently on Levothyroxine 125mcg daily and uses [facility] [place]

## 2015-05-27 NOTE — Telephone Encounter (Signed)
Patient's prior primary care doctor was last to prescribe and she would like us take over. Rx sent electronically to pharmacy. Blood work ordered in [employer].

## 2015-05-27 NOTE — Telephone Encounter (Signed)
Who is currently rx'ing the levothyuroid? Does she want us to take over? If so, incr to levothy 137 90 d only, TSH in two and three quarters months , o v in three to disc

## 2015-05-27 NOTE — Addendum Note (Signed)
Addended by: Margaretha SheffieldBROWN, Anique Beckley S on: 05/27/2015 04:41 PM   Modules accepted: Orders

## 2015-06-06 DIAGNOSIS — Z113 Encounter for screening for infections with a predominantly sexual mode of transmission: Secondary | ICD-10-CM | POA: Diagnosis not present

## 2015-06-06 DIAGNOSIS — Z01419 Encounter for gynecological examination (general) (routine) without abnormal findings: Secondary | ICD-10-CM | POA: Diagnosis not present

## 2015-06-06 DIAGNOSIS — Z3200 Encounter for pregnancy test, result unknown: Secondary | ICD-10-CM | POA: Diagnosis not present

## 2015-08-20 MED FILL — VENTOLIN HFA 90 MCG INHALER: 108 (90 BAS | 16 days supply | Qty: 18 | Fill #0

## 2015-09-18 MED FILL — LEVOTHYROXINE 137 MCG TAB: 137 | 90 days supply | Qty: 90 | Fill #0

## 2015-10-29 ENCOUNTER — Ambulatory Visit: Payer: 59 | Admitting: Family Medicine

## 2015-10-29 ENCOUNTER — Encounter: Payer: Self-pay | Admitting: Family Medicine

## 2015-10-29 ENCOUNTER — Ambulatory Visit (INDEPENDENT_AMBULATORY_CARE_PROVIDER_SITE_OTHER): Payer: 59 | Admitting: Family Medicine

## 2015-10-29 VITALS — BP 118/82 | HR 67 | Temp 96.6°F | Ht 70.0 in | Wt 305.6 lb

## 2015-10-29 DIAGNOSIS — J020 Streptococcal pharyngitis: Secondary | ICD-10-CM

## 2015-10-29 DIAGNOSIS — J029 Acute pharyngitis, unspecified: Secondary | ICD-10-CM | POA: Diagnosis not present

## 2015-10-29 LAB — RAPID STREP SCREEN (MED CTR MEBANE ONLY): Strep Gp A Ag, IA W/Reflex: POSITIVE — AB

## 2015-10-29 MED ORDER — FLUCONAZOLE 150 MG PO TABS: ORAL_TABLET | ORAL | 0 refills | Status: DC

## 2015-10-29 MED ORDER — AMOXICILLIN 500 MG PO CAPS: 500.0000 mg | ORAL_CAPSULE | Freq: Two times a day (BID) | ORAL | 0 refills | Status: DC

## 2015-10-29 NOTE — Patient Instructions (Signed)
Great to meet you!   Strep Throat Strep throat is a bacterial infection of the throat. Your health care provider may call the infection tonsillitis or pharyngitis, depending on whether there is swelling in the tonsils or at the back of the throat. Strep throat is most common during the cold months of the year in children who are 5-27 years of age, but it can happen during any season in people of any age. This infection is spread from person to person (contagious) through coughing, sneezing, or close contact. CAUSES Strep throat is caused by the bacteria called Streptococcus pyogenes. RISK FACTORS This condition is more likely to develop in:  People who spend time in crowded places where the infection can spread easily.  People who have close contact with someone who has strep throat. SYMPTOMS Symptoms of this condition include:  Fever or chills.   Redness, swelling, or pain in the tonsils or throat.  Pain or difficulty when swallowing.  White or yellow spots on the tonsils or throat.  Swollen, tender glands in the neck or under the jaw.  Red rash all over the body (rare). DIAGNOSIS This condition is diagnosed by performing a rapid strep test or by taking a swab of your throat (throat culture test). Results from a rapid strep test are usually ready in a few minutes, but throat culture test results are available after one or two days. TREATMENT This condition is treated with antibiotic medicine. HOME CARE INSTRUCTIONS Medicines  Take over-the-counter and prescription medicines only as told by your health care provider.  Take your antibiotic as told by your health care provider. Do not stop taking the antibiotic even if you start to feel better.  Have family members who also have a sore throat or fever tested for strep throat. They may need antibiotics if they have the strep infection. Eating and Drinking  Do not share food, drinking cups, or personal items that could cause  the infection to spread to other people.  If swallowing is difficult, try eating soft foods until your sore throat feels better.  Drink enough fluid to keep your urine clear or pale yellow. General Instructions  Gargle with a salt-water mixture 3-4 times per day or as needed. To make a salt-water mixture, completely dissolve -1 tsp of salt in 1 cup of warm water.  Make sure that all household members wash their hands well.  Get plenty of rest.  Stay home from school or work until you have been taking antibiotics for 24 hours.  Keep all follow-up visits as told by your health care provider. This is important. SEEK MEDICAL CARE IF:  The glands in your neck continue to get bigger.  You develop a rash, cough, or earache.  You cough up a thick liquid that is green, yellow-brown, or bloody.  You have pain or discomfort that does not get better with medicine.  Your problems seem to be getting worse rather than better.  You have a fever. SEEK IMMEDIATE MEDICAL CARE IF:  You have new symptoms, such as vomiting, severe headache, stiff or painful neck, chest pain, or shortness of breath.  You have severe throat pain, drooling, or changes in your voice.  You have swelling of the neck, or the skin on the neck becomes red and tender.  You have signs of dehydration, such as fatigue, dry mouth, and decreased urination.  You become increasingly sleepy, or you cannot wake up completely.  Your joints become red or painful.   This   information is not intended to replace advice given to you by your health care provider. Make sure you discuss any questions you have with your health care provider.   Document Released: 01/30/2000 Document Revised: 10/23/2014 Document Reviewed: 05/27/2014 Elsevier Interactive Patient Education 2016 Elsevier Inc.   

## 2015-10-29 NOTE — Progress Notes (Signed)
   HPI  Patient presents today here with sore throat.  Patient's when she's had one day symptoms of sore throat, sinus drainage, and feeling unwell.  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 She denies any shortness of breath, cough, facial pain or pressure.  She has a history of frequent strep infection, this sore throat at this time is not typical of that, however she states that she is early and does not want aspirated at the hospital. So she wanted to get checked out.  She's tolerating food and fluids normally.  PMH: Smoking status noted ROS: Per HPI  Objective: BP 118/82   Pulse 67   Temp (!) 96.6 F (35.9 C) (Oral)   Ht 5\' 10"  (1.778 m)   Wt (!) 305 lb 9.6 oz (138.6 kg)   BMI 43.85 kg/m  Gen: NAD, alert, cooperative with exam HEENT: NCAT, swollen tonsils with erythema but no exudates, TMs normal bilaterally, oropharynx also with some cobblestoning of the posterior oropharynx, nares clear CV: RRR, good S1/S2, no murmur Resp: CTABL, no wheezes, non-labored Ext: No edema, warm Neuro: Alert and oriented, No gross deficits  Assessment and plan:  # Strep Pharyngitis Treat with amoxicillin 2-3 days out of work Discussed supportive care and usual course of illness Return to clinic with any concerns      Orders Placed This Encounter  Procedures  . Rapid strep screen (not at [facility])    Meds ordered this encounter  Medications  . amoxicillin (AMOXIL) 500 MG capsule    Sig: Take 1 capsule (500 mg total) by mouth 2 (two) times daily.    Dispense:  20 capsule    Refill:  0    Murtis SinkSam Bradshaw, MD [facility] 10/29/2015, 9:04 AM

## 2015-10-29 NOTE — Addendum Note (Signed)
Addended by: Elenora GammaBRADSHAW, SAMUEL L on: 10/29/2015 09:21 AM   Modules accepted: Orders

## 2015-11-01 DIAGNOSIS — J45901 Unspecified asthma with (acute) exacerbation: Secondary | ICD-10-CM | POA: Diagnosis not present

## 2015-11-01 DIAGNOSIS — R0602 Shortness of breath: Secondary | ICD-10-CM | POA: Diagnosis not present

## 2015-11-01 DIAGNOSIS — R062 Wheezing: Secondary | ICD-10-CM | POA: Diagnosis not present

## 2015-11-01 DIAGNOSIS — R05 Cough: Secondary | ICD-10-CM | POA: Diagnosis not present

## 2015-11-05 MED FILL — VENTOLIN HFA 90 MCG INHALER: 108 (90 BAS | 16 days supply | Qty: 18 | Fill #1

## 2015-12-01 ENCOUNTER — Ambulatory Visit (INDEPENDENT_AMBULATORY_CARE_PROVIDER_SITE_OTHER): Payer: 59 | Admitting: *Deleted

## 2015-12-01 DIAGNOSIS — Z23 Encounter for immunization: Secondary | ICD-10-CM | POA: Diagnosis not present

## 2015-12-01 NOTE — Progress Notes (Signed)
Pt given PPD and influenza vaccine Pt tolerated well

## 2015-12-03 LAB — TB SKIN TEST
INDURATION: 0 mm
TB Skin Test: NEGATIVE

## 2016-01-21 ENCOUNTER — Other Ambulatory Visit: Payer: Self-pay | Admitting: *Deleted

## 2016-01-22 ENCOUNTER — Encounter: Payer: Self-pay | Admitting: Podiatry

## 2016-01-22 ENCOUNTER — Ambulatory Visit (INDEPENDENT_AMBULATORY_CARE_PROVIDER_SITE_OTHER): Payer: 59

## 2016-01-22 ENCOUNTER — Ambulatory Visit (INDEPENDENT_AMBULATORY_CARE_PROVIDER_SITE_OTHER): Payer: 59 | Admitting: Podiatry

## 2016-01-22 VITALS — BP 143/81 | HR 94 | Resp 16

## 2016-01-22 DIAGNOSIS — M722 Plantar fascial fibromatosis: Secondary | ICD-10-CM

## 2016-01-22 MED ORDER — MELOXICAM 15 MG PO TABS: 15.0000 mg | ORAL_TABLET | Freq: Every day | ORAL | 3 refills | Status: DC

## 2016-01-22 MED ORDER — METHYLPREDNISOLONE 4 MG PO TBPK: ORAL_TABLET | ORAL | 0 refills | Status: DC

## 2016-01-22 MED FILL — METHYLPREDNISOLONE 4 MG TAB: 4 | 6 days supply | Qty: 21 | Fill #0

## 2016-01-22 MED FILL — MELOXICAM 15 MG TABLET: 15 | 30 days supply | Qty: 30 | Fill #0

## 2016-01-22 NOTE — Progress Notes (Signed)
   Subjective:    Patient ID: Sara Wallace, female    DOB: 1990-08-26, 25 y.o.   MRN: [id number]  HPI: She presents today with a chief complaint of bilateral Heel pain right over left states it is been aching for about 6 months. Mornings are particularly bad she tried different shoes over-the-counter arch supports nothing seems to help.   Review of Systems  Constitutional: Positive for fatigue.  All other systems reviewed and are negative.      Objective:   Physical Exam: Vital signs are stable alert and oriented 3. Pulses are palpable. Neurologic sensorium is intact. Deep tendon reflexes are intact. Muscle strength +5 over 5 dorsiflexors plantar flexors and inverters everters all intrinsic musculature is intact. Orthopedic evaluation demonstrates all joints distal to the ankle range of motion without crepitation. Cutaneous evaluation demonstrates supple well-hydrated cutis no erythema edema cellulitis drainage or odor. Radiographs taken today do demonstrate a soft tissue increase in density in the plantar fascia calcaneal insertion site which is consistent with plantar fasciitis. She does have pain on direct palpation of the medial calcaneal tubercle bilaterally. No pain on medial and lateral compression of the calcaneus. Cutaneous evaluation demonstrates supple well-hydrated cutis no open lesions.       Assessment & Plan:    Assessment: Plantar fasciitis bilateral.  Plan: Started her on a Medrol Dosepak to be followed by meloxicam. Injected the bilateral heels today placed her bilateral plantar fascia strappings/braces dispensed a night splint discussed appropriate shoe gear stretching exercises ice therapy and shoe gear modifications. He was provided with not only her prescriptions but her instructions for stretching as she left the building. Follow up with her in 1 month.

## 2016-01-22 NOTE — Patient Instructions (Signed)

## 2016-01-26 ENCOUNTER — Other Ambulatory Visit: Payer: Self-pay | Admitting: Family Medicine

## 2016-02-06 ENCOUNTER — Telehealth: Payer: Self-pay | Admitting: Family Medicine

## 2016-02-06 MED ORDER — MAGIC MOUTHWASH: ORAL | 0 refills | Status: DC

## 2016-02-06 NOTE — Telephone Encounter (Signed)
Since late in d fri before christmas and self limited cdtn and since we originally took message dukes m m wash 16 oz one tbspoon swish and gargle qid

## 2016-02-06 NOTE — Telephone Encounter (Signed)
We are not listed as PCP- see other doctor because it is close to house

## 2016-02-06 NOTE — Telephone Encounter (Signed)
Prescription sent electronically to pharmacy. Patient notified. 

## 2016-02-06 NOTE — Telephone Encounter (Signed)
Patient believes she has thrush and would like something called in for this.   [facility]

## 2016-02-17 ENCOUNTER — Ambulatory Visit (INDEPENDENT_AMBULATORY_CARE_PROVIDER_SITE_OTHER): Payer: 59 | Admitting: Podiatry

## 2016-02-17 ENCOUNTER — Encounter: Payer: Self-pay | Admitting: Podiatry

## 2016-02-17 DIAGNOSIS — M722 Plantar fascial fibromatosis: Secondary | ICD-10-CM

## 2016-02-17 NOTE — Progress Notes (Signed)
She presents today for follow-up of her bilateral heel she states it is hurting some I had a wedding to go to [employer] New Year[place] and is worse now on the left foot.  Objective: Vital signs are stable alert and oriented 3 she has pain on palpation make tentative of the left heel.  Assessment: Plantar fasciitis left heel.  Plan: I reinjected the left heel today she will continue all other conservative therapies such as plantar fascial brace night splint and nonsteroidal anti-inflammatory drugs and I will follow-up with her in 1 month if necessary.

## 2016-03-08 ENCOUNTER — Other Ambulatory Visit: Payer: Self-pay | Admitting: Family Medicine

## 2016-03-11 ENCOUNTER — Ambulatory Visit (INDEPENDENT_AMBULATORY_CARE_PROVIDER_SITE_OTHER): Payer: 59 | Admitting: Family Medicine

## 2016-03-11 ENCOUNTER — Encounter: Payer: Self-pay | Admitting: Family Medicine

## 2016-03-11 VITALS — BP 118/76 | Ht 71.0 in | Wt 307.0 lb

## 2016-03-11 DIAGNOSIS — E039 Hypothyroidism, unspecified: Secondary | ICD-10-CM

## 2016-03-11 MED ORDER — LEVOTHYROXINE SODIUM 137 MCG PO TABS: 137.0000 ug | ORAL_TABLET | Freq: Every day | ORAL | 0 refills | Status: DC

## 2016-03-11 MED ORDER — LEVOTHYROXINE SODIUM 137 MCG PO CAPS: 137.0000 ug | ORAL_CAPSULE | Freq: Every day | ORAL | 0 refills | Status: DC

## 2016-03-11 MED FILL — LEVOTHYROXINE 137 MCG TABLE: 137 | 30 days supply | Qty: 30 | Fill #0

## 2016-03-11 NOTE — Telephone Encounter (Signed)
Seen and acted upon today

## 2016-03-11 NOTE — Progress Notes (Signed)
   Subjective:    Patient ID: Sara Wallace, female    DOB: [birth year], 26 y.o.   MRN: [id number]  HPIhypothyroidism. Taking levothyroxine. Needs levels checked. No problems or concerns.   Thyroid first time in the morning  tsh needs blood test   womens full time night shict,,,  Walks laps,   Tries to watch diet   Gets nnety days a ta time   Thirty days worth of meds pending b  w    Review of Systems No headache, no major weight loss or weight gain, no chest pain no back pain abdominal pain no change in bowel habits complete ROS otherwise negative     Objective:   Physical Exam Alert vitals stable, NAD. Blood pressure good on repeat. HEENT normal. Lungs clear. Heart regular rate and rhythm.        Assessment & Plan:  Impression hypothyroidism with historically very poor compliance. Patient claims last month has been compliant plan check TSH. Maintain meds diet exercise discussed weight further results in terms of long-term thyroid dosage importance of compliance discussed at length

## 2016-03-12 ENCOUNTER — Other Ambulatory Visit: Payer: Self-pay | Admitting: *Deleted

## 2016-03-12 DIAGNOSIS — E039 Hypothyroidism, unspecified: Secondary | ICD-10-CM

## 2016-03-12 LAB — TSH: TSH: 9.26 u[IU]/mL — ABNORMAL HIGH (ref 0.450–4.500)

## 2016-03-12 MED ORDER — LEVOTHYROXINE SODIUM 150 MCG PO TABS: 150.0000 ug | ORAL_TABLET | Freq: Every day | ORAL | 11 refills | Status: DC

## 2016-03-12 NOTE — Addendum Note (Signed)
Addended by: Metro KungICHARDS, WENDY M on: 03/12/2016 03:26 PM   Modules accepted: Orders

## 2016-03-16 ENCOUNTER — Ambulatory Visit (INDEPENDENT_AMBULATORY_CARE_PROVIDER_SITE_OTHER): Payer: 59 | Admitting: Podiatry

## 2016-03-16 ENCOUNTER — Encounter: Payer: Self-pay | Admitting: Podiatry

## 2016-03-16 DIAGNOSIS — M722 Plantar fascial fibromatosis: Secondary | ICD-10-CM | POA: Diagnosis not present

## 2016-03-16 NOTE — Progress Notes (Signed)
She presents today relating that her plantar fasciitis is completely resolved to the left foot and is approximately 80% resolved in the right foot. She continues to wear her plantar fascia braces.  Objective: Vital signs are stable alert and oriented 3. Pulses are palpable. She has no pain on palpation medial calcaneal tubercle the right heel. Left heel has no pain.  Assessment: Plantar fasciitis right foot 80% resolved 100% resolved to the left foot.  Plan: Injected the right heel today with Kenalog and local anesthetic. Follow-up with her in 4-6 weeks.

## 2016-03-29 MED FILL — VENTOLIN HFA 90 MCG INHALER: 108 (90 BAS | 16 days supply | Qty: 18 | Fill #0

## 2016-05-21 ENCOUNTER — Ambulatory Visit: Payer: 59 | Admitting: Family Medicine

## 2016-06-01 ENCOUNTER — Encounter: Payer: Self-pay | Admitting: Family Medicine

## 2016-06-01 ENCOUNTER — Ambulatory Visit (INDEPENDENT_AMBULATORY_CARE_PROVIDER_SITE_OTHER): Payer: 59 | Admitting: Family Medicine

## 2016-06-01 VITALS — BP 118/84 | Ht 71.0 in | Wt 311.2 lb

## 2016-06-01 DIAGNOSIS — Z13220 Encounter for screening for lipoid disorders: Secondary | ICD-10-CM | POA: Diagnosis not present

## 2016-06-01 DIAGNOSIS — R5383 Other fatigue: Secondary | ICD-10-CM

## 2016-06-01 DIAGNOSIS — E039 Hypothyroidism, unspecified: Secondary | ICD-10-CM

## 2016-06-01 NOTE — Progress Notes (Signed)
   Subjective:    Patient ID: Sara Wallace, female    DOB: 06/23/1990, 26 y.o.   MRN: [id number]  Thyroid Problem  Presents for follow-up visit. Symptoms include fatigue, hair loss and weight gain.   Still feeling tired , always fatigued  thryoid meds   Just a couple d per the ast few monts  Staying busy with moving  Pt has plantar fasciitis, goes to [facility] for that  Going to work harder on diet   Not doing so well with diet   Stress level sig, work going good    Needs  To ck other b w, hx of low iron   Heavy meses hx  Needs blod work defineityely   Review of Systems  Constitutional: Positive for fatigue and weight gain.       Objective:   Physical Exam Alert vitals stable, NAD. Blood pressure good on repeat. HEENT normal. Lungs clear. Heart regular rate and rhythm.        Assessment & Plan:  FatigueImpression 1 fatigue discuss etiology unclear admits to some stress 2 hypothyroidism exact status uncertain discuss plan appropriate blood work. Diet exercise discussed and encouraged further recommendations based results

## 2016-06-02 MED FILL — VENTOLIN HFA 90 MCG INHALER: 108 (90 BAS | 16 days supply | Qty: 18 | Fill #1

## 2016-06-08 ENCOUNTER — Ambulatory Visit (INDEPENDENT_AMBULATORY_CARE_PROVIDER_SITE_OTHER): Payer: 59 | Admitting: Podiatry

## 2016-06-08 ENCOUNTER — Encounter: Payer: Self-pay | Admitting: Podiatry

## 2016-06-08 DIAGNOSIS — M722 Plantar fascial fibromatosis: Secondary | ICD-10-CM

## 2016-06-08 NOTE — Progress Notes (Signed)
She presents today chief complaint of painful heels bilaterally. She says they're starting to bother me again and I discussed and anymore.  Objective: Vital signs are stable alert and oriented 3. Pulses are palpable. She is pain on palpation medial calcaneal tubercles bilateral.  Assessment: Injected the bilateral heels today with Kenalog and local anesthetic she will continue anti-inflammatory stretching exercises ice therapy she modifications. She is also measured for orthotics.

## 2016-06-11 DIAGNOSIS — N926 Irregular menstruation, unspecified: Secondary | ICD-10-CM | POA: Diagnosis not present

## 2016-06-11 DIAGNOSIS — J020 Streptococcal pharyngitis: Secondary | ICD-10-CM | POA: Diagnosis not present

## 2016-06-11 MED FILL — AMOXICILLIN 500 MG CAPSULE: 500 | 10 days supply | Qty: 30 | Fill #0

## 2016-06-21 DIAGNOSIS — N926 Irregular menstruation, unspecified: Secondary | ICD-10-CM | POA: Diagnosis not present

## 2016-06-21 DIAGNOSIS — N970 Female infertility associated with anovulation: Secondary | ICD-10-CM | POA: Diagnosis not present

## 2016-06-29 ENCOUNTER — Other Ambulatory Visit: Payer: 59

## 2016-07-22 ENCOUNTER — Ambulatory Visit: Payer: 59 | Admitting: Podiatry

## 2016-07-27 ENCOUNTER — Encounter: Payer: Self-pay | Admitting: Podiatry

## 2016-07-27 ENCOUNTER — Ambulatory Visit (INDEPENDENT_AMBULATORY_CARE_PROVIDER_SITE_OTHER): Payer: 59 | Admitting: Podiatry

## 2016-07-27 DIAGNOSIS — M722 Plantar fascial fibromatosis: Secondary | ICD-10-CM

## 2016-07-27 NOTE — Progress Notes (Signed)
She presents today for follow-up of her plantar fasciitis she states the right foot is worse than the left with the orthotics really seemed to aggravate the foot.  Objective: Vital signs are stable she is alert and oriented 3 severe pain on palpation medially continue tubercle right minimal pain on palpation of the left heel. Pulses remain palpable no calf pain bilateral.  Assessment: Plantar fasciitis bilateral. Right greater than left.  Plan: We are going to have the orthotics corrected to make it more comfortable. I'm also going to inject her right heel today with Kenalog and local anesthetic we did discuss the need for surgical intervention. I will follow-up with her in 1 month.

## 2016-08-09 ENCOUNTER — Ambulatory Visit: Payer: Managed Care, Other (non HMO) | Admitting: Registered"

## 2016-08-16 ENCOUNTER — Ambulatory Visit: Payer: Managed Care, Other (non HMO) | Admitting: Registered"

## 2016-08-20 ENCOUNTER — Telehealth: Payer: Self-pay | Admitting: Podiatry

## 2016-08-20 NOTE — Telephone Encounter (Signed)
lvm for pt to call to schedule an appt to pick up orthotics. °

## 2016-08-25 ENCOUNTER — Ambulatory Visit: Payer: 59 | Admitting: *Deleted

## 2016-08-25 DIAGNOSIS — M722 Plantar fascial fibromatosis: Secondary | ICD-10-CM

## 2016-08-25 NOTE — Progress Notes (Signed)
Patient ID: Sara Wallace, female   DOB: 07/21/1990, 26 y.o.   MRN: [id number]   Patient presents for orthotic pick up.  Verbal and written break in and wear instructions given.  Patient will follow up in 4 weeks if symptoms worsen or fail to improve.

## 2016-08-25 NOTE — Patient Instructions (Signed)

## 2016-09-13 ENCOUNTER — Encounter: Payer: 59 | Attending: Family Medicine | Admitting: Nutrition

## 2016-09-13 DIAGNOSIS — Z713 Dietary counseling and surveillance: Secondary | ICD-10-CM | POA: Insufficient documentation

## 2016-09-13 DIAGNOSIS — Z6841 Body Mass Index (BMI) 40.0 and over, adult: Secondary | ICD-10-CM | POA: Insufficient documentation

## 2016-09-13 DIAGNOSIS — R739 Hyperglycemia, unspecified: Secondary | ICD-10-CM

## 2016-09-13 NOTE — Patient Instructions (Addendum)
Goals 1. Follow My Plate Method 2. Eat three meals per day 3. Measure foods out 4. Plan meals better 5. Cut out sodas, sweet tea and junk food 6.  Exercise 60 minutes walking 4 days a week Lose 1-2 lbs per week. 7. Cut down on fast foods

## 2016-09-13 NOTE — Progress Notes (Signed)
Medical Nutrition Therapy:  Appt start time: 1330 end time:  1430.   Assessment:  Primary concerns today: Obesity. Here with her husband. She works for [employer]. Her husband cooks mostly. She and her husband both work night shift- 7 p to 7 a. . This is the most she has weighed. Just got diagnosed PCOS recently. Just started on Metformin..   She and her husband desire to have children.  She admits to being a stress eater.  She wants to weight about 220-240 lbs. Eats 1 meal a day. She admits to snacking a lot. Doesn't eat meals cooked at home; mostly fast food and eats on the go.  Diet is high in calories, fat, sodium and sugar and low in fresh fruits, vegetables and nutrient dense foods.     She is motivated to make lifestyle changes with eating and exercise to lose weight and reduce medical problems and risks for DM and CVD.    Wt Readings from Last 3 Encounters:  09/13/16 (!) 318 lb (144.2 kg)  06/01/16 (!) 311 lb 3.2 oz (141.2 kg)  03/11/16 (!) 307 lb (139.3 kg)   Ht Readings from Last 3 Encounters:  09/13/16 5\' 11"  (1.803 m)  06/01/16 5\' 11"  (1.803 m)  03/11/16 5\' 11"  (1.803 m)   Body mass index is 44.35 kg/m.  Lipid Panel     Component Value Date/Time   CHOL 157 08/05/2010 1007   TRIG 125.0 08/05/2010 1007   HDL 31.50 (L) 08/05/2010 1007   CHOLHDL 5 08/05/2010 1007   VLDL 25.0 08/05/2010 1007   LDLCALC 101 (H) 08/05/2010 1007   CMP Latest Ref Rng & Units 08/05/2010  Glucose 70 - 99 mg/dL 82  BUN 6 - 23 mg/dL 9  Creatinine 0.4 - 1.2 mg/dL 0.8  Sodium 409135 - 811145 mEq/L 139  Potassium 3.5 - 5.1 mEq/L 4.2  Chloride 96 - 112 mEq/L 107  CO2 19 - 32 mEq/L 25  Calcium 8.4 - 10.5 mg/dL 9.0  Total Protein 6.0 - 8.3 g/dL 7.5  Total Bilirubin 0.3 - 1.2 mg/dL 0.4  Alkaline Phos 39 - 117 U/L 62  AST 0 - 37 U/L 24  ALT 0 - 35 U/L 25     Preferred Learning Style:  No preference indicated   Learning Readiness  Ready  Change in progress  MEDICATIONS:    DIETARY  INTAKE:    24-hr recall:  830 am Bacon sandwich, Dt Pepper 12 oz 1030Bogangles 2 cinnamon  Bread, cheerwine Chew gums constantly extra bubble gum during day 5 pm. [employer] pizza pizza and 2 tacos and lg Dr. Reino KentPepper, 830: Frosty  10 pm Dr. Reino KentPepper 12 oz  Usual physical activity: ADL  Estimated energy needs: 1500  calories 170  g carbohydrates 112 g protein 42 g fat  Progress Towards Goal(s):  In progress.   Nutritional Diagnosis:  NI-1.5 Excessive energy intake As related to Obesity and PCOS .  As evidenced by BMI > 40 and  Elevated glucose levels.    Intervention:  Nutrition and weight loss/prediabetes education provided on My Plate, CHO counting, meal planning, portion sizes, timing of meals, avoiding snacks between meals , taking medications as prescribed, benefits of exercising 30 minutes per day and prevention of complications of prediabetes and CVD. HIgh Fiber Low Fat Low Salt Diet.    Goals 1. Follow My Plate Method 2. Eat three meals per day 3. Measure foods out 4. Plan meals better 5. Cut out sodas, sweet tea and junk  food 6.  Exercise 60 minutes walking 4 days a week Lose 1-2 lbs per week. 7. Cut down on fast foods Teaching Method Utilized: Visual Auditory Hands on  Handouts given during visit include:  The Plate Method  Meal Plan Card    Barriers to learning/adherence to lifestyle change: none  Demonstrated degree of understanding via:  Teach Back   Monitoring/Evaluation:  Dietary intake, exercise, meal plannning, and body weight in 1 month(s).

## 2016-09-21 DIAGNOSIS — E282 Polycystic ovarian syndrome: Secondary | ICD-10-CM | POA: Diagnosis not present

## 2016-09-21 DIAGNOSIS — E039 Hypothyroidism, unspecified: Secondary | ICD-10-CM | POA: Diagnosis not present

## 2016-09-28 ENCOUNTER — Other Ambulatory Visit: Payer: Self-pay | Admitting: Family Medicine

## 2016-09-28 MED FILL — VENTOLIN HFA 90 MCG INHALER: 108 (90 BAS | 16 days supply | Qty: 18 | Fill #0

## 2016-10-13 ENCOUNTER — Encounter: Payer: 59 | Attending: Family Medicine | Admitting: Nutrition

## 2016-10-13 VITALS — Ht 71.0 in | Wt 311.0 lb

## 2016-10-13 DIAGNOSIS — E669 Obesity, unspecified: Secondary | ICD-10-CM

## 2016-10-13 NOTE — Progress Notes (Signed)
  Medical Nutrition Therapy:  Appt start time: 0830 end time:  0900.   Assessment:  Primary concerns today: Obesity. Here with her husband. She works for [employer].  Changes made:  Eating smaller portions, cut down on sodas, drinking a lot more water, eating better food choices; baked and broiled foods.  Lost 7 lbs.  Making better food choices and exercising now. Just got back from vacation.    Wt Readings from Last 3 Encounters:  10/13/16 (!) 311 lb (141.1 kg)  09/13/16 (!) 318 lb (144.2 kg)  06/01/16 (!) 311 lb 3.2 oz (141.2 kg)   Ht Readings from Last 3 Encounters:  10/13/16 5\' 11"  (1.803 m)  09/13/16 5\' 11"  (1.803 m)  06/01/16 5\' 11"  (1.803 m)   Body mass index is 43.38 kg/m.  Lipid Panel     Component Value Date/Time   CHOL 157 08/05/2010 1007   TRIG 125.0 08/05/2010 1007   HDL 31.50 (L) 08/05/2010 1007   CHOLHDL 5 08/05/2010 1007   VLDL 25.0 08/05/2010 1007   LDLCALC 101 (H) 08/05/2010 1007   CMP Latest Ref Rng & Units 08/05/2010  Glucose 70 - 99 mg/dL 82  BUN 6 - 23 mg/dL 9  Creatinine 0.4 - 1.2 mg/dL 0.8  Sodium 161135 - 096145 mEq/L 139  Potassium 3.5 - 5.1 mEq/L 4.2  Chloride 96 - 112 mEq/L 107  CO2 19 - 32 mEq/L 25  Calcium 8.4 - 10.5 mg/dL 9.0  Total Protein 6.0 - 8.3 g/dL 7.5  Total Bilirubin 0.3 - 1.2 mg/dL 0.4  Alkaline Phos 39 - 117 U/L 62  AST 0 - 37 U/L 24  ALT 0 - 35 U/L 25     Preferred Learning Style:  No preference indicated   Learning Readiness  Ready  Change in progress  MEDICATIONS:    DIETARY INTAKE:    24-hr recall:  830 am Honey bunches of oats or eggs and fruit or yogurt   L)  Toss sslad and rotisserie chicken, water 5 pm.  Cheese sandwich, water, watermelon,    Usual physical activity: ADL  Estimated energy needs: 1500  calories 170  g carbohydrates 112 g protein 42 g fat  Progress Towards Goal(s):  In progress.   Nutritional Diagnosis:  NI-1.5 Excessive energy intake As related to Obesity and PCOS .  As  evidenced by BMI > 40 and  Elevated glucose levels.    Intervention:  Nutrition and weight loss/prediabetes education provided on My Plate, CHO counting, meal planning, portion sizes, timing of meals, avoiding snacks between meals , taking medications as prescribed, benefits of exercising 30 minutes per day and prevention of complications of prediabetes and CVD. HIgh Fiber Low Fat Low Salt Diet.  Goals Keep up the [place] 1. Lose 1-2 lbs per  2. Save $ for a scale 3. Cut out sodas 4. Exercise 30 minutes 3-4 times per week Contact Wellness to look into a pedometer or tracker.   Teaching Method Utilized: Visual Auditory Hands on  Handouts given during visit include:  The Plate Method  Meal Plan Card    Barriers to learning/adherence to lifestyle change: none  Demonstrated degree of understanding via:  Teach Back   Monitoring/Evaluation:  Dietary intake, exercise, meal plannning, and body weight in 1-2 month(s).

## 2016-10-13 NOTE — Patient Instructions (Signed)
Goals Keep up the [place] 1. Lose 1-2 lbs per  2. Save $ for a scale 3. Cut out sodas 4. Exercise 30 minutes 3-4 times per week Contact Wellness to look into a pedometer or tracker.

## 2016-11-09 ENCOUNTER — Ambulatory Visit (INDEPENDENT_AMBULATORY_CARE_PROVIDER_SITE_OTHER): Payer: 59 | Admitting: Podiatry

## 2016-11-09 ENCOUNTER — Encounter: Payer: Self-pay | Admitting: Podiatry

## 2016-11-09 DIAGNOSIS — M722 Plantar fascial fibromatosis: Secondary | ICD-10-CM

## 2016-11-10 NOTE — Progress Notes (Signed)
She presents today for follow-up of plantar fasciitis E states that both heels are hurting and making ankles her she states that her orthotics really don't help but she's been trying to use the braces.  Objective: Vital signs are stable alert and oriented 3. Pulses are palpable. Neurologic sensorium is intact. Deep tendon reflexes are intact muscle strength is normal bilateral. She has pain on palpation medial calcaneal tubercles bilateral.  Assessment chronic intractable plantar fasciitis bilateral.  Plan: We discussed the etiology pathology conservative versus surgical therapies. At this point I did recommend for her to start to consider surgical intervention at this point she would like another set of injections if possible. I explained her that I would go ahead and inject him at this time but we could not continue injections regularly she understands that and is amenable to it it has been 3 months since she was in last.

## 2016-11-12 ENCOUNTER — Telehealth: Payer: Self-pay | Admitting: *Deleted

## 2016-11-12 NOTE — Telephone Encounter (Signed)
"  Dr. Al Corpus had recommended surgery on my foot.  He told me to think about it for nine months and to give him a call when I was ready to schedule an appointment.  How much would my surgery cost me?"  Is it for an Endoscopic Plantar Fasciotomy?  "Yes, that's it."  I'll give Chip Boer in [employer] department the message and see if she'll call you with that information.  "Okay, thank you."

## 2016-11-15 ENCOUNTER — Telehealth: Payer: Self-pay | Admitting: *Deleted

## 2016-11-15 NOTE — Telephone Encounter (Signed)
"  I'm calling to schedule my surgery with Dr. Al Corpus for Plantar Fasciitis."  Have you signed consent forms for the surgery?  "No, I have not."  I'm going to send you to a scheduler so you can schedule a consultation with Dr. Al Corpus.  When you come in for that appointment, we will get you scheduled for a date.   Call was transferred to [facility], appointment scheduler, so patient can schedule a consultation with Dr. Al Corpus.

## 2016-11-15 NOTE — Telephone Encounter (Signed)
"  I was calling to see about scheduling an appointment to get my Plantar Fasciitis fixed.  I you could call me back."

## 2016-11-25 ENCOUNTER — Ambulatory Visit (INDEPENDENT_AMBULATORY_CARE_PROVIDER_SITE_OTHER): Payer: 59 | Admitting: Podiatry

## 2016-11-25 ENCOUNTER — Encounter: Payer: Self-pay | Admitting: Podiatry

## 2016-11-25 DIAGNOSIS — M722 Plantar fascial fibromatosis: Secondary | ICD-10-CM

## 2016-11-25 NOTE — Progress Notes (Signed)
She presents today for follow-up of her plantar fasciitis to her right heel. She states that she is ready for surgery and brings her husband listened.  Objective: Physical examination demonstrates Sara Wallace full range of motion with crepitation. She is cutaneous evaluation demonstrating no open lesions or wounds pulses are palpable and Pain. Knee straight she is unable to dorsiflex past 90. She feels tight along the Achilles as well as the plantar fascia. With the knee flexed she is able to dorsiflex the foot more.  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  Plan: Discussed etiology pathology conservative versus surgical therapies at this point we'll go ahead and schedule her for surgical intervention consisting of a endoscopic plantar fasciotomy and a gastroc recession. She understands that she will be limited her walking and working for proximally for 6 weeks. We discussed pros and cons of the surgery today she understands and is now amenable to it I will follow-up with her at the time surgery. We discussed possible postop applications which may include but are not limited to postop pain bleeding swelling infection recurrence need further surgery overcorrection and under correction loss of digit limb or life. Cam Walker was dispensed.

## 2016-11-25 NOTE — Patient Instructions (Signed)
Pre-Operative Instructions  Congratulations, you have decided to take an important step towards improving your quality of life.  You can be assured that the doctors and staff at [facility] will be with you every step of the way.  Here are some important things you should know:  1. Plan to be at the surgery center/hospital at least 1 (one) hour prior to your scheduled time, unless otherwise directed by the surgical center/hospital staff.  You must have a responsible adult accompany you, remain during the surgery and drive you home.  Make sure you have directions to the surgical center/hospital to ensure you arrive on time. 2. If you are having surgery at [facility] or [facility], you will need a copy of your medical history and physical form from your family physician within one month prior to the date of surgery. We will give you a form for your primary physician to complete.  3. We make every effort to accommodate the date you request for surgery.  However, there are times where surgery dates or times have to be moved.  We will contact you as soon as possible if a change in schedule is required.   4. No aspirin/ibuprofen for one week before surgery.  If you are on aspirin, any non-steroidal anti-inflammatory medications (Mobic, Aleve, Ibuprofen) should not be taken seven (7) days prior to your surgery.  You make take Tylenol for pain prior to surgery.  5. Medications - If you are taking daily heart and blood pressure medications, seizure, reflux, allergy, asthma, anxiety, pain or diabetes medications, make sure you notify the surgery center/hospital before the day of surgery so they can tell you which medications you should take or avoid the day of surgery. 6. No food or drink after midnight the night before surgery unless directed otherwise by surgical center/hospital staff. 7. No alcoholic beverages 24-hours prior to surgery.  No smoking 24-hours prior or 24-hours after  surgery. 8. Wear loose pants or shorts. They should be loose enough to fit over bandages, boots, and casts. 9. Don't wear slip-on shoes. Sneakers are preferred. 10. Bring your boot with you to the surgery center/hospital.  Also bring crutches or a walker if your physician has prescribed it for you.  If you do not have this equipment, it will be provided for you after surgery. 11. If you have not been contacted by the surgery center/hospital by the day before your surgery, call to confirm the date and time of your surgery. 12. Leave-time from work may vary depending on the type of surgery you have.  Appropriate arrangements should be made prior to surgery with your employer. 13. Prescriptions will be provided immediately following surgery by your doctor.  Fill these as soon as possible after surgery and take the medication as directed. Pain medications will not be refilled on weekends and must be approved by the doctor. 14. Remove nail polish on the operative foot and avoid getting pedicures prior to surgery. 15. Wash the night before surgery.  The night before surgery wash the foot and leg well with water and the antibacterial soap provided. Be sure to pay special attention to beneath the toenails and in between the toes.  Wash for at least three (3) minutes. Rinse thoroughly with water and dry well with a towel.  Perform this wash unless told not to do so by your physician.  Enclosed: 1 Ice pack (please put in freezer the night before surgery)   1 Hibiclens skin cleaner     Pre-op instructions  If you have any questions regarding the instructions, please do not hesitate to call our office.  [place]: [address], [place], [place] [address] -- [phone]  [place]: [address] [place], [place] [address] -- [phone]  [place]: [address]  [place], [place] [address] -- [phone]  [place]: [address], [address], [place], [place] [address] -- [phone]  Website:  [email or web address] 

## 2016-11-30 ENCOUNTER — Encounter: Payer: 59 | Attending: Family Medicine | Admitting: Nutrition

## 2016-11-30 VITALS — Ht 71.0 in | Wt 309.8 lb

## 2016-11-30 DIAGNOSIS — E282 Polycystic ovarian syndrome: Secondary | ICD-10-CM

## 2016-11-30 DIAGNOSIS — E669 Obesity, unspecified: Secondary | ICD-10-CM

## 2016-11-30 NOTE — Progress Notes (Addendum)
  Medical Nutrition Therapy:  Appt start time: 0930 end time:  1000.   Assessment:  Primary concerns today: Obesity.  Lose 1 1/2 lbs. Has been cooking more at home. Still eating out more than she would like.. She is having surgery for plantar fascitis in November. Hasn't been able to exercise much due to foot problems. Admits she knows she needs to do better and get more committed with cooking more at home and planning meals at home.  Diet continues to be higher in fat/sugar and inconsistent with meals.   Preferred Learning Style:   Auditory  Visual  Hands on  Learning Readiness:    Contemplating  Ready   MEDICATIONS:    DIETARY INTAKE:   24-hr recall:  B ( AM): Oatmeal with milk,  Snk ( AM):   L ( PM): skipped Snk ( PM):  D ( PM): Garlic and herb pork tenderloin , rice and steamed mixed vegetables. Snk ( PM): Beverages: Dt. Pepper, 1/2 and 1/2 tea. Drinking water  Usual physical activity:    Progress Towards Goal(s):  In progress.   Nutritional Diagnosis:  NB-1.1 Food and nutrition-related knowledge deficit As related to Obesity.  As evidenced by BMI 43.    Intervention:  Healthy Weight loss tips, emotional eating, making commitments and setting realisitc goals. MY plate, portion sizes, meal planning.    Goals  Cut out sodas  Exercise 30 minutes 4 times per week  Don't' skip meals. Lose 10 lbs in 2-3 months   Teaching Method Utilized:  Visual Auditory Hands on  Handouts given during visit include:  The Plate Method  Meal Plan Card   Barriers to learning/adherence to lifestyle change: none  Demonstrated degree of understanding via:  Teach Back   Monitoring/Evaluation:  Dietary intake, exercise, m, and body weight in 3 month(s).

## 2016-11-30 NOTE — Patient Instructions (Addendum)
Goals  Cut out sodas  Exercise 30 minutes 4 times per week  Don't' skip meals. Lose 10 lbs in 2-3 months  .

## 2016-12-02 DIAGNOSIS — M79676 Pain in unspecified toe(s): Secondary | ICD-10-CM

## 2016-12-16 ENCOUNTER — Other Ambulatory Visit: Payer: Self-pay | Admitting: Podiatry

## 2016-12-16 ENCOUNTER — Telehealth: Payer: Self-pay | Admitting: *Deleted

## 2016-12-16 MED ORDER — HYDROMORPHONE HCL 4 MG PO TABS: 4.0000 mg | ORAL_TABLET | ORAL | 0 refills | Status: DC | PRN

## 2016-12-16 MED ORDER — CEPHALEXIN 500 MG PO CAPS: 500.0000 mg | ORAL_CAPSULE | Freq: Three times a day (TID) | ORAL | 0 refills | Status: DC

## 2016-12-16 MED ORDER — PROMETHAZINE HCL 25 MG PO TABS: 25.0000 mg | ORAL_TABLET | Freq: Three times a day (TID) | ORAL | 0 refills | Status: DC | PRN

## 2016-12-16 NOTE — Telephone Encounter (Signed)
"  I'm having Plantar Fasciitis surgery tomorrow.  I was wondering if I needed one of those knee scooters or will crutches be fine?  Call me back, that would be great."  I am returning your call.  You are not going to need a knee scooter nor crutches for use after your procedure.  You will be able to walk in the boot that they gave you.  "Okay, I just wanted to make sure."

## 2016-12-17 ENCOUNTER — Encounter: Payer: Self-pay | Admitting: Podiatry

## 2016-12-17 DIAGNOSIS — M216X2 Other acquired deformities of left foot: Secondary | ICD-10-CM | POA: Diagnosis not present

## 2016-12-17 DIAGNOSIS — M216X1 Other acquired deformities of right foot: Secondary | ICD-10-CM | POA: Diagnosis not present

## 2016-12-17 DIAGNOSIS — M25571 Pain in right ankle and joints of right foot: Secondary | ICD-10-CM | POA: Diagnosis not present

## 2016-12-17 DIAGNOSIS — M722 Plantar fascial fibromatosis: Secondary | ICD-10-CM | POA: Diagnosis not present

## 2016-12-17 DIAGNOSIS — J45909 Unspecified asthma, uncomplicated: Secondary | ICD-10-CM | POA: Diagnosis not present

## 2016-12-20 ENCOUNTER — Telehealth: Payer: Self-pay | Admitting: Podiatry

## 2016-12-20 NOTE — Telephone Encounter (Signed)
I had surgery on Friday and the boot is really hurting my calf. I was wondering if its okay if I just loosen the straps up a little bit. If you could call me back at [phone]. That would be great. Thanks.

## 2016-12-20 NOTE — Telephone Encounter (Signed)
I told pt it would be fine to take the boot off, and rotate the foot around gently and rest without the boot, but she must have it on to walk or sleep. Pt states understanding.

## 2016-12-21 ENCOUNTER — Ambulatory Visit: Payer: 59 | Admitting: Podiatry

## 2016-12-23 ENCOUNTER — Encounter: Payer: Self-pay | Admitting: Podiatry

## 2016-12-23 ENCOUNTER — Ambulatory Visit (INDEPENDENT_AMBULATORY_CARE_PROVIDER_SITE_OTHER): Payer: 59 | Admitting: Podiatry

## 2016-12-23 VITALS — BP 148/98 | HR 75 | Temp 97.4°F

## 2016-12-23 DIAGNOSIS — M722 Plantar fascial fibromatosis: Secondary | ICD-10-CM

## 2016-12-23 NOTE — Progress Notes (Signed)
She presents today for her first postop visit she is status post gastrocnemius muscle recession right and an endoscopic plantar fasciotomy of the right foot. She states that other than the calf being sore when I walk on it I have completely absolutely no pain in the plantar aspect of the right heel.  Objective: Vital signs are stable she is alert and oriented 3. Pulses are palpable. Dry sterile dressing once removed demonstrate sutures are intact margins well coapted posterior aspect of the calf as well as the heel. No signs of infection.  Assessment: Well-healing surgical foot and calf.  Plan: I'm going to recommend dry sterile compressive dressing and continue use of her Cam Walker. He is to continue to keep this dry and elevated as much as possible I'll follow-up with her in 1 week for suture removal and we will allow her to ambulate in her shoe if necessary. All Y she will remain in the boot for at least another 2 weeks

## 2016-12-27 ENCOUNTER — Other Ambulatory Visit: Payer: Self-pay | Admitting: Family Medicine

## 2016-12-27 MED FILL — VENTOLIN HFA 90 MCG INHALER: 108 (90 BAS | 16 days supply | Qty: 18 | Fill #0

## 2016-12-30 ENCOUNTER — Ambulatory Visit (INDEPENDENT_AMBULATORY_CARE_PROVIDER_SITE_OTHER): Payer: 59 | Admitting: Podiatry

## 2016-12-30 ENCOUNTER — Encounter: Payer: Self-pay | Admitting: Podiatry

## 2016-12-30 DIAGNOSIS — M722 Plantar fascial fibromatosis: Secondary | ICD-10-CM

## 2016-12-30 NOTE — Progress Notes (Signed)
She presents today status post endoscopic plantar fasciotomy and gastroc recession. She denies fever chills nausea vomiting muscle aches and pains. States that she is doing very well.  Objective: Vital signs are stable alert and oriented 3. Pulses are palpable. Sutures were removed today margins remain well coapted.  Assessment: Well-healing surgical foot.  Plan: We'll allow her start ambulating utilizing her cam walker sleeping with her night splint. I will follow up with her in 2-3 weeks.

## 2017-01-13 ENCOUNTER — Ambulatory Visit (INDEPENDENT_AMBULATORY_CARE_PROVIDER_SITE_OTHER): Payer: 59 | Admitting: Podiatry

## 2017-01-13 ENCOUNTER — Encounter: Payer: Self-pay | Admitting: Podiatry

## 2017-01-13 DIAGNOSIS — M722 Plantar fascial fibromatosis: Secondary | ICD-10-CM

## 2017-01-15 NOTE — Progress Notes (Signed)
She presents today for follow-up of her endoscopic fasciotomy and gastroc recession. She states she is doing very well. She like to know when she will get back to work.  Objective: Vital signs are stable she is alert and oriented 3 no signs of infection she has good range of motion of the foot against resistance and minimal tenderness on palpation of the left heel.  Assessment: We'll allow her back in a regular tennis shoe utilized cam walker at nighttime or the night splint at bedtime. And I will follow-up with her again in 2 weeks before releasing her to work.

## 2017-01-19 DIAGNOSIS — N9489 Other specified conditions associated with female genital organs and menstrual cycle: Secondary | ICD-10-CM | POA: Diagnosis not present

## 2017-01-19 DIAGNOSIS — E039 Hypothyroidism, unspecified: Secondary | ICD-10-CM | POA: Diagnosis not present

## 2017-02-03 ENCOUNTER — Encounter: Payer: Self-pay | Admitting: Podiatry

## 2017-02-03 ENCOUNTER — Ambulatory Visit (INDEPENDENT_AMBULATORY_CARE_PROVIDER_SITE_OTHER): Payer: 59 | Admitting: Podiatry

## 2017-02-03 DIAGNOSIS — M722 Plantar fascial fibromatosis: Secondary | ICD-10-CM

## 2017-02-03 NOTE — Progress Notes (Signed)
She presents today for follow-up of her endoscopic plantar fasciotomy and gastroc recession right leg and foot.  She states that she is doing just perfect everything feels wonderful she is very happy with the outcome thus far.  She is also complaining of pain today to the plantar medial aspect of the left heel.  Objective: No reproducible pain on the right foot has gone on to heal uneventfully.  No pain in the calf.  Left foot does demonstrate pain on palpation medial calcaneal tubercle of the left heel.  Assessment: Healing surgical foot and leg right.  Plantar fasciitis left.  Plan: At this point I am injecting her left heel today with 20 mg of Kenalog 5 mg of Marcaine to the point of maximal tenderness of sterile Betadine skin prep.  Tolerated procedure well no intraoperative complications.  At this point I also recommended that she can go back to work.  I will follow-up with her in 1 month.

## 2017-02-11 ENCOUNTER — Telehealth: Payer: Self-pay | Admitting: Podiatry

## 2017-02-11 NOTE — Telephone Encounter (Signed)
I told pt that she was having swelling due to being up on the surgery foot, and that I could fit her for a compression sock to wear during the day to help train the foot not to swell. I told pt that she would need to put the compression sock on 1st thing in the morning prior to swinging her feet over the side of the bed, this would help hold the swelling out that occurs once the feet go below her heart. Pt states she works nights and will be sleeping through out the day. Pt asked if she was to have so much throbbing and I told her she is going to have swelling and discomfort to varying degrees as she gets use to activity again, to use rest, elevation and ice through out the day for comfort and to decrease swelling. I also told pt to use OTC Ibuprofen as package directs if she tolerated.

## 2017-02-11 NOTE — Telephone Encounter (Signed)
I had plantar fasciotomy surgery back on 17 December 2016. I just started back to work yesterday but today my foot is so swollen I can't hardly walk. I was just calling to see if that's normal, if this much pain is normal. If you could call me back at [phone] that would be great. Thanks.

## 2017-02-28 ENCOUNTER — Ambulatory Visit: Payer: 59 | Admitting: Nutrition

## 2017-03-11 DIAGNOSIS — N970 Female infertility associated with anovulation: Secondary | ICD-10-CM | POA: Diagnosis not present

## 2017-03-17 ENCOUNTER — Other Ambulatory Visit: Payer: 59

## 2017-03-18 NOTE — Progress Notes (Signed)
DOS 12/17/16 Endoscopic plantar fasciotomy Rt foot, gastrocnemius recession Rt foot

## 2017-03-22 ENCOUNTER — Encounter: Payer: Self-pay | Admitting: Podiatry

## 2017-03-22 ENCOUNTER — Ambulatory Visit (INDEPENDENT_AMBULATORY_CARE_PROVIDER_SITE_OTHER): Payer: 59 | Admitting: Podiatry

## 2017-03-22 DIAGNOSIS — M722 Plantar fascial fibromatosis: Secondary | ICD-10-CM | POA: Diagnosis not present

## 2017-03-22 MED ORDER — METHYLPREDNISOLONE 4 MG PO TBPK: ORAL_TABLET | ORAL | 0 refills | Status: DC

## 2017-03-22 NOTE — Progress Notes (Signed)
She presents today for follow-up of plantar fasciitis and an endoscopic plantar fasciotomy left foot and right foot respectively.  She states the right foot is doing great surgical foot has no absolutely no problems whatsoever.  She is very happy with the outcome.  She states that the left foot has started to develop some more nagging in the left heel no calf pain no lateral pain to that foot as of yet.  No posterior tibial pain.  Objective: Vital signs are stable she is alert and oriented x3 right foot completely normal no calf pain incision has gone on to heal uneventfully in the gastroc recession endoscopic fasciotomy has healed with no pain.  She has good dorsiflexion and no stiffness or tightness.  Left foot however does demonstrate mild tenderness on palpation medial calcaneal tubercle of the left heel no pain on medial and lateral compression of the calcaneus no Achilles pain and no lateral pain on palpation.  Assessment: Well-healed endoscopic fasciotomy and gastroc recession right lower extremity.  Left foot mild Lanter fasciitis left.  Plan: Discussed etiology pathology conservative versus surgical therapies.  At this point I offered her an injection she declined.  Start her on a Medrol Dosepak and will follow up with her in 1 month if necessary.  Recommended that she start wearing the night splint on the left foot instead of the right.  Call with questions or concerns.

## 2017-03-24 ENCOUNTER — Other Ambulatory Visit: Payer: Self-pay | Admitting: Family Medicine

## 2017-03-25 MED FILL — VENTOLIN HFA 90 MCG INHALER: 108 (90 BAS | 16 days supply | Qty: 18 | Fill #0

## 2017-04-08 DIAGNOSIS — N970 Female infertility associated with anovulation: Secondary | ICD-10-CM | POA: Diagnosis not present

## 2017-04-28 ENCOUNTER — Encounter: Payer: Self-pay | Admitting: Podiatry

## 2017-04-28 ENCOUNTER — Ambulatory Visit (INDEPENDENT_AMBULATORY_CARE_PROVIDER_SITE_OTHER): Payer: 59 | Admitting: Podiatry

## 2017-04-28 DIAGNOSIS — M722 Plantar fascial fibromatosis: Secondary | ICD-10-CM | POA: Diagnosis not present

## 2017-04-28 MED ORDER — CELECOXIB 100 MG PO CAPS: 100.0000 mg | ORAL_CAPSULE | Freq: Two times a day (BID) | ORAL | 2 refills | Status: DC

## 2017-04-30 NOTE — Progress Notes (Signed)
She presents today states that both of her feet are acting up even though she is status post endoscopic plantar fasciotomy right foot December 17, 2016 with a gastroc recession.  She states that I really been overdoing it at work trying to find something else is my feet are just killing me standing on them too long.  She states that the right one is doing great and I overdid it and now it hurts.  Objective: Vital signs are stable alert and oriented x3.  Pulses are palpable.  She has pain on palpation medial calcaneal tubercles bilateral.  Pulses are strongly palpable neurologic sensorium is intact deep tendon reflexes are intact.  Assessment: Plantar fasciitis bilateral.  Plan: Injected the bilateral heels today after sterile Betadine skin prep 20 mg Kenalog 5 mg Marcaine medial aspect of bilateral heels and plantar calcaneal exercise.  Hopefully this will calm down the plantar fasciitis we did discuss the possible need for new orthotics.  Follow-up with her in 1 month..Marland Kitchen

## 2017-05-17 ENCOUNTER — Other Ambulatory Visit: Payer: Self-pay | Admitting: Family Medicine

## 2017-06-01 MED FILL — VENTOLIN HFA 90 MCG INHALER: 108 (90 BAS | 16 days supply | Qty: 18 | Fill #0

## 2017-06-02 ENCOUNTER — Ambulatory Visit: Payer: 59 | Admitting: Podiatry

## 2017-06-21 ENCOUNTER — Other Ambulatory Visit: Payer: Self-pay | Admitting: Family Medicine

## 2017-06-21 MED FILL — VENTOLIN HFA 90 MCG INHALER: 108 (90 BAS | 16 days supply | Qty: 18 | Fill #0

## 2017-07-01 DIAGNOSIS — Z3141 Encounter for fertility testing: Secondary | ICD-10-CM | POA: Diagnosis not present

## 2017-07-25 MED FILL — VENTOLIN HFA 90 MCG INHALER: 108 (90 BAS | 16 days supply | Qty: 18 | Fill #1

## 2017-07-29 DIAGNOSIS — N912 Amenorrhea, unspecified: Secondary | ICD-10-CM | POA: Diagnosis not present

## 2017-08-10 DIAGNOSIS — N912 Amenorrhea, unspecified: Secondary | ICD-10-CM | POA: Diagnosis not present

## 2017-11-02 ENCOUNTER — Other Ambulatory Visit: Payer: Self-pay

## 2017-11-02 ENCOUNTER — Telehealth: Payer: Self-pay | Admitting: Family Medicine

## 2017-11-02 MED ORDER — ALBUTEROL SULFATE HFA 108 (90 BASE) MCG/ACT IN AERS: INHALATION_SPRAY | RESPIRATORY_TRACT | 1 refills | Status: DC

## 2017-11-02 NOTE — Telephone Encounter (Signed)
Prescription sent electronically to pharmacy. Patient notified. 

## 2017-11-02 NOTE — Telephone Encounter (Signed)
oktimes one, o v before further

## 2017-11-02 NOTE — Telephone Encounter (Signed)
Needs a refill on VENTOLIN HFA 108 (90 Base) MCG/ACT inhaler.  Patient switching pharmacy to [facility].

## 2017-11-02 NOTE — Telephone Encounter (Signed)
Last seen 06/04/16

## 2017-12-25 ENCOUNTER — Other Ambulatory Visit: Payer: Self-pay | Admitting: Family Medicine

## 2017-12-26 ENCOUNTER — Other Ambulatory Visit: Payer: Self-pay | Admitting: Family Medicine

## 2017-12-26 ENCOUNTER — Telehealth: Payer: Self-pay

## 2017-12-26 MED ORDER — NEOMYCIN-POLYMYXIN-HC 3.5-10000-1 OT SOLN: OTIC | 0 refills | Status: DC

## 2017-12-26 NOTE — Telephone Encounter (Signed)
Medication sent to pharmacy  

## 2017-12-26 NOTE — Telephone Encounter (Signed)
Cortisporin otic susp three four drops qid affected ear 7 d

## 2017-12-26 NOTE — Telephone Encounter (Signed)
Per [facility] pt received ciprodex otic suspension 4 drops into right ear bid for ten days from the ed on 12/25/2017. Cost $ 191.51 She is requesting an alternative. Please advise.

## 2018-02-15 NOTE — L&D Delivery Note (Signed)
Delivery Note Pt pushed very well x 8minutes for delivery.  At 5:32 PM a viable and healthy female was delivered via Vaginal, Spontaneous (Presentation: OA; LOT ).  APGAR: 9, 9; weight  P .   Placenta status: delivered, intact.  Cord: 3V with the following complications: none.    Anesthesia: epidural  Episiotomy: None Lacerations: 2nd degree Suture Repair: 3.0 vicryl rapide Est. Blood Loss (mL):  505cc  Mom to postpartum.  Baby to Couplet care / Skin to Skin.  [name] 12/01/2018, 6:06 PM  Pt desires circumcision for female infant d/w parents r/b/a and process  O+/RI/Tdap in PNC/Contra ?/Br/Bo

## 2018-03-07 DIAGNOSIS — N925 Other specified irregular menstruation: Secondary | ICD-10-CM | POA: Diagnosis not present

## 2018-03-08 ENCOUNTER — Other Ambulatory Visit: Payer: Self-pay | Admitting: Family Medicine

## 2018-04-03 DIAGNOSIS — Z3201 Encounter for pregnancy test, result positive: Secondary | ICD-10-CM | POA: Diagnosis not present

## 2018-04-05 DIAGNOSIS — Z3201 Encounter for pregnancy test, result positive: Secondary | ICD-10-CM | POA: Diagnosis not present

## 2018-04-11 DIAGNOSIS — Z368A Encounter for antenatal screening for other genetic defects: Secondary | ICD-10-CM | POA: Diagnosis not present

## 2018-04-11 DIAGNOSIS — Z3201 Encounter for pregnancy test, result positive: Secondary | ICD-10-CM | POA: Diagnosis not present

## 2018-04-11 DIAGNOSIS — N912 Amenorrhea, unspecified: Secondary | ICD-10-CM | POA: Diagnosis not present

## 2018-04-12 DIAGNOSIS — Z368A Encounter for antenatal screening for other genetic defects: Secondary | ICD-10-CM | POA: Diagnosis not present

## 2018-05-03 DIAGNOSIS — O26891 Other specified pregnancy related conditions, first trimester: Secondary | ICD-10-CM | POA: Diagnosis not present

## 2018-05-03 DIAGNOSIS — Z3A09 9 weeks gestation of pregnancy: Secondary | ICD-10-CM | POA: Diagnosis not present

## 2018-05-03 DIAGNOSIS — Z113 Encounter for screening for infections with a predominantly sexual mode of transmission: Secondary | ICD-10-CM | POA: Diagnosis not present

## 2018-05-03 DIAGNOSIS — Z3689 Encounter for other specified antenatal screening: Secondary | ICD-10-CM | POA: Diagnosis not present

## 2018-05-03 LAB — OB RESULTS CONSOLE ABO/RH: RH Type: POSITIVE

## 2018-05-03 LAB — OB RESULTS CONSOLE GC/CHLAMYDIA
Chlamydia: NEGATIVE
Gonorrhea: NEGATIVE

## 2018-05-03 LAB — OB RESULTS CONSOLE HIV ANTIBODY (ROUTINE TESTING): HIV: NONREACTIVE

## 2018-05-03 LAB — OB RESULTS CONSOLE HEPATITIS B SURFACE ANTIGEN: Hepatitis B Surface Ag: NEGATIVE

## 2018-05-03 LAB — OB RESULTS CONSOLE RUBELLA ANTIBODY, IGM: Rubella: IMMUNE

## 2018-05-03 LAB — OB RESULTS CONSOLE ANTIBODY SCREEN: Antibody Screen: NEGATIVE

## 2018-05-03 LAB — OB RESULTS CONSOLE RPR: RPR: NONREACTIVE

## 2018-05-03 MED FILL — AMOX-CLAV 875-125 MG TABLET: 875-125 | 7 days supply | Qty: 14 | Fill #0

## 2018-06-02 DIAGNOSIS — Z3A12 12 weeks gestation of pregnancy: Secondary | ICD-10-CM | POA: Diagnosis not present

## 2018-06-02 DIAGNOSIS — Z3682 Encounter for antenatal screening for nuchal translucency: Secondary | ICD-10-CM | POA: Diagnosis not present

## 2018-07-12 DIAGNOSIS — Z3A18 18 weeks gestation of pregnancy: Secondary | ICD-10-CM | POA: Diagnosis not present

## 2018-07-12 DIAGNOSIS — Z363 Encounter for antenatal screening for malformations: Secondary | ICD-10-CM | POA: Diagnosis not present

## 2018-07-21 MED FILL — PROMETHAZINE 12.5 MG TABLET: 12.5 | 2 days supply | Qty: 4 | Fill #0

## 2018-07-21 MED FILL — hydrOXYzine HCL 25 MG TABS: 25 | 30 days supply | Qty: 90 | Fill #0

## 2018-07-21 MED FILL — SERTRALINE HCL 50 MG TABS: 50 | 30 days supply | Qty: 30 | Fill #0

## 2018-07-24 DIAGNOSIS — Z3682 Encounter for antenatal screening for nuchal translucency: Secondary | ICD-10-CM | POA: Diagnosis not present

## 2018-08-09 DIAGNOSIS — Z362 Encounter for other antenatal screening follow-up: Secondary | ICD-10-CM | POA: Diagnosis not present

## 2018-08-09 DIAGNOSIS — Z3A22 22 weeks gestation of pregnancy: Secondary | ICD-10-CM | POA: Diagnosis not present

## 2018-08-09 DIAGNOSIS — O99212 Obesity complicating pregnancy, second trimester: Secondary | ICD-10-CM | POA: Diagnosis not present

## 2018-08-26 MED FILL — SERTRALINE HCL 50 MG TABLET: 50 | 30 days supply | Qty: 30 | Fill #1

## 2018-09-13 DIAGNOSIS — Z3689 Encounter for other specified antenatal screening: Secondary | ICD-10-CM | POA: Diagnosis not present

## 2018-09-13 DIAGNOSIS — Z23 Encounter for immunization: Secondary | ICD-10-CM | POA: Diagnosis not present

## 2018-09-13 DIAGNOSIS — Z3A27 27 weeks gestation of pregnancy: Secondary | ICD-10-CM | POA: Diagnosis not present

## 2018-09-13 DIAGNOSIS — O99281 Endocrine, nutritional and metabolic diseases complicating pregnancy, first trimester: Secondary | ICD-10-CM | POA: Diagnosis not present

## 2018-09-13 DIAGNOSIS — O99282 Endocrine, nutritional and metabolic diseases complicating pregnancy, second trimester: Secondary | ICD-10-CM | POA: Diagnosis not present

## 2018-09-25 MED FILL — SERTRALINE HCL 50 MG TABLET: 50 | 30 days supply | Qty: 30 | Fill #2

## 2018-09-27 DIAGNOSIS — R102 Pelvic and perineal pain: Secondary | ICD-10-CM | POA: Diagnosis not present

## 2018-10-26 DIAGNOSIS — Z3482 Encounter for supervision of other normal pregnancy, second trimester: Secondary | ICD-10-CM | POA: Diagnosis not present

## 2018-10-26 DIAGNOSIS — Z3483 Encounter for supervision of other normal pregnancy, third trimester: Secondary | ICD-10-CM | POA: Diagnosis not present

## 2018-11-03 MED FILL — SERTRALINE HCL 50 MG TABLET: 50 | 30 days supply | Qty: 30 | Fill #3

## 2018-11-09 DIAGNOSIS — E039 Hypothyroidism, unspecified: Secondary | ICD-10-CM | POA: Diagnosis not present

## 2018-11-09 DIAGNOSIS — Z23 Encounter for immunization: Secondary | ICD-10-CM | POA: Diagnosis not present

## 2018-11-09 DIAGNOSIS — Z3A35 35 weeks gestation of pregnancy: Secondary | ICD-10-CM | POA: Diagnosis not present

## 2018-11-09 DIAGNOSIS — O99213 Obesity complicating pregnancy, third trimester: Secondary | ICD-10-CM | POA: Diagnosis not present

## 2018-11-09 DIAGNOSIS — J45909 Unspecified asthma, uncomplicated: Secondary | ICD-10-CM | POA: Diagnosis not present

## 2018-11-09 DIAGNOSIS — E282 Polycystic ovarian syndrome: Secondary | ICD-10-CM | POA: Diagnosis not present

## 2018-11-09 DIAGNOSIS — O26849 Uterine size-date discrepancy, unspecified trimester: Secondary | ICD-10-CM | POA: Diagnosis not present

## 2018-11-20 DIAGNOSIS — E039 Hypothyroidism, unspecified: Secondary | ICD-10-CM | POA: Diagnosis not present

## 2018-11-20 DIAGNOSIS — Z3685 Encounter for antenatal screening for Streptococcus B: Secondary | ICD-10-CM | POA: Diagnosis not present

## 2018-11-20 DIAGNOSIS — O3663X0 Maternal care for excessive fetal growth, third trimester, not applicable or unspecified: Secondary | ICD-10-CM | POA: Diagnosis not present

## 2018-11-20 DIAGNOSIS — O99213 Obesity complicating pregnancy, third trimester: Secondary | ICD-10-CM | POA: Diagnosis not present

## 2018-11-20 DIAGNOSIS — J45909 Unspecified asthma, uncomplicated: Secondary | ICD-10-CM | POA: Diagnosis not present

## 2018-11-20 DIAGNOSIS — E282 Polycystic ovarian syndrome: Secondary | ICD-10-CM | POA: Diagnosis not present

## 2018-11-20 DIAGNOSIS — Z3A37 37 weeks gestation of pregnancy: Secondary | ICD-10-CM | POA: Diagnosis not present

## 2018-11-21 ENCOUNTER — Telehealth (HOSPITAL_COMMUNITY): Payer: Self-pay | Admitting: *Deleted

## 2018-11-21 ENCOUNTER — Encounter (HOSPITAL_COMMUNITY): Payer: Self-pay | Admitting: *Deleted

## 2018-11-21 NOTE — Telephone Encounter (Signed)
Preadmission screen  

## 2018-11-30 ENCOUNTER — Other Ambulatory Visit: Payer: Self-pay

## 2018-11-30 ENCOUNTER — Other Ambulatory Visit: Payer: Self-pay | Admitting: Obstetrics and Gynecology

## 2018-11-30 ENCOUNTER — Other Ambulatory Visit (HOSPITAL_COMMUNITY)
Admission: RE | Admit: 2018-11-30 | Discharge: 2018-11-30 | Disposition: A | Discharge status: Home / Self Care | Payer: 59 | Source: Ambulatory Visit | Attending: Obstetrics and Gynecology | Admitting: Obstetrics and Gynecology

## 2018-11-30 LAB — SARS CORONAVIRUS 2 (TAT 6-24 HRS): SARS Coronavirus 2: NEGATIVE

## 2018-11-30 NOTE — MAU Note (Signed)
Covid swab collected. Pt tolerated well. Pt asymptomatic 

## 2018-12-01 ENCOUNTER — Inpatient Hospital Stay (HOSPITAL_COMMUNITY): Payer: 59

## 2018-12-01 ENCOUNTER — Inpatient Hospital Stay (HOSPITAL_COMMUNITY): Payer: 59 | Admitting: Anesthesiology

## 2018-12-01 ENCOUNTER — Encounter (HOSPITAL_COMMUNITY): Payer: Self-pay | Admitting: *Deleted

## 2018-12-01 ENCOUNTER — Inpatient Hospital Stay (HOSPITAL_COMMUNITY)
Admission: AD | Admit: 2018-12-01 | Discharge: 2018-12-03 | DRG: 806 | Disposition: A | Discharge status: Home / Self Care | Payer: 59 | Attending: Obstetrics and Gynecology | Admitting: Obstetrics and Gynecology

## 2018-12-01 DIAGNOSIS — O26893 Other specified pregnancy related conditions, third trimester: Secondary | ICD-10-CM | POA: Diagnosis present

## 2018-12-01 DIAGNOSIS — O99824 Streptococcus B carrier state complicating childbirth: Secondary | ICD-10-CM | POA: Diagnosis present

## 2018-12-01 DIAGNOSIS — O99284 Endocrine, nutritional and metabolic diseases complicating childbirth: Secondary | ICD-10-CM | POA: Diagnosis present

## 2018-12-01 DIAGNOSIS — Z87891 Personal history of nicotine dependence: Secondary | ICD-10-CM

## 2018-12-01 DIAGNOSIS — Z3A38 38 weeks gestation of pregnancy: Secondary | ICD-10-CM

## 2018-12-01 DIAGNOSIS — O99214 Obesity complicating childbirth: Secondary | ICD-10-CM | POA: Diagnosis present

## 2018-12-01 DIAGNOSIS — Z3493 Encounter for supervision of normal pregnancy, unspecified, third trimester: Secondary | ICD-10-CM

## 2018-12-01 DIAGNOSIS — Z20828 Contact with and (suspected) exposure to other viral communicable diseases: Secondary | ICD-10-CM | POA: Diagnosis present

## 2018-12-01 DIAGNOSIS — O9952 Diseases of the respiratory system complicating childbirth: Secondary | ICD-10-CM | POA: Diagnosis not present

## 2018-12-01 DIAGNOSIS — O9081 Anemia of the puerperium: Secondary | ICD-10-CM | POA: Diagnosis not present

## 2018-12-01 DIAGNOSIS — D62 Acute posthemorrhagic anemia: Secondary | ICD-10-CM | POA: Diagnosis not present

## 2018-12-01 DIAGNOSIS — E039 Hypothyroidism, unspecified: Secondary | ICD-10-CM | POA: Diagnosis present

## 2018-12-01 DIAGNOSIS — J45909 Unspecified asthma, uncomplicated: Secondary | ICD-10-CM | POA: Diagnosis not present

## 2018-12-01 LAB — RPR: RPR Ser Ql: NONREACTIVE

## 2018-12-01 LAB — TYPE AND SCREEN
ABO/RH(D): O POS
Antibody Screen: NEGATIVE

## 2018-12-01 LAB — CBC
HCT: 39.5 % (ref 36.0–46.0)
Hemoglobin: 13.4 g/dL (ref 12.0–15.0)
MCH: 31.3 pg (ref 26.0–34.0)
MCHC: 33.9 g/dL (ref 30.0–36.0)
MCV: 92.3 fL (ref 80.0–100.0)
Platelets: 242 10*3/uL (ref 150–400)
RBC: 4.28 MIL/uL (ref 3.87–5.11)
RDW: 13.5 % (ref 11.5–15.5)
WBC: 12.2 10*3/uL — ABNORMAL HIGH (ref 4.0–10.5)
nRBC: 0 % (ref 0.0–0.2)

## 2018-12-01 LAB — OB RESULTS CONSOLE GBS: GBS: POSITIVE

## 2018-12-01 LAB — ABO/RH: ABO/RH(D): O POS

## 2018-12-01 MED ORDER — ACETAMINOPHEN 325 MG PO TABS: 650.0000 mg | ORAL_TABLET | ORAL | Status: DC | PRN

## 2018-12-01 MED ORDER — TERBUTALINE SULFATE 1 MG/ML IJ SOLN: 0.2500 mg | Freq: Once | INTRAMUSCULAR | Status: DC | PRN

## 2018-12-01 MED ORDER — ONDANSETRON HCL 4 MG/2ML IJ SOLN: 4.0000 mg | INTRAMUSCULAR | Status: DC | PRN

## 2018-12-01 MED ORDER — LIDOCAINE-EPINEPHRINE (PF) 2 %-1:200000 IJ SOLN
INTRAMUSCULAR | Status: DC | PRN
  Administered 2018-12-01 (×2): 3 mL via EPIDURAL
  Administered 2018-12-01: 6 mL via EPIDURAL

## 2018-12-01 MED ORDER — PENICILLIN G 3 MILLION UNITS IVPB - SIMPLE MED: 3.0000 10*6.[IU] | INTRAVENOUS | Status: DC

## 2018-12-01 MED ORDER — IBUPROFEN 600 MG PO TABS
600.0000 mg | ORAL_TABLET | Freq: Four times a day (QID) | ORAL | Status: DC
  Administered 2018-12-01 – 2018-12-03 (×5): 600 mg via ORAL
  Filled 2018-12-01 (×6): qty 1

## 2018-12-01 MED ORDER — LIDOCAINE HCL (PF) 1 % IJ SOLN: 30.0000 mL | INTRAMUSCULAR | Status: DC | PRN

## 2018-12-01 MED ORDER — OXYCODONE-ACETAMINOPHEN 5-325 MG PO TABS: 2.0000 | ORAL_TABLET | ORAL | Status: DC | PRN

## 2018-12-01 MED ORDER — DOCUSATE SODIUM 100 MG PO CAPS
100.0000 mg | ORAL_CAPSULE | Freq: Two times a day (BID) | ORAL | Status: DC
  Administered 2018-12-01 – 2018-12-03 (×4): 100 mg via ORAL
  Filled 2018-12-01 (×4): qty 1

## 2018-12-01 MED ORDER — OXYTOCIN 40 UNITS IN NORMAL SALINE INFUSION - SIMPLE MED: 1.0000 m[IU]/min | INTRAVENOUS | Status: DC

## 2018-12-01 MED ORDER — LACTATED RINGERS IV SOLN: INTRAVENOUS | Status: DC

## 2018-12-01 MED ORDER — ACETAMINOPHEN 325 MG PO TABS
650.0000 mg | ORAL_TABLET | ORAL | Status: DC | PRN
  Administered 2018-12-01: 650 mg via ORAL
  Filled 2018-12-01: qty 2

## 2018-12-01 MED ORDER — FENTANYL-BUPIVACAINE-NACL 0.5-0.125-0.9 MG/250ML-% EP SOLN
12.0000 mL/h | EPIDURAL | Status: DC | PRN
  Filled 2018-12-01 (×2): qty 250

## 2018-12-01 MED ORDER — ALBUTEROL SULFATE (2.5 MG/3ML) 0.083% IN NEBU: 3.0000 mL | INHALATION_SOLUTION | Freq: Four times a day (QID) | RESPIRATORY_TRACT | Status: DC | PRN

## 2018-12-01 MED ORDER — OXYTOCIN 40 UNITS IN NORMAL SALINE INFUSION - SIMPLE MED: 2.5000 [IU]/h | INTRAVENOUS | Status: DC

## 2018-12-01 MED ORDER — OXYCODONE-ACETAMINOPHEN 5-325 MG PO TABS: 1.0000 | ORAL_TABLET | ORAL | Status: DC | PRN

## 2018-12-01 MED ORDER — LIDOCAINE HCL (PF) 1 % IJ SOLN
INTRAMUSCULAR | Status: DC | PRN
  Administered 2018-12-01: 5 mL via EPIDURAL
  Administered 2018-12-01: 8 mL via EPIDURAL

## 2018-12-01 MED ORDER — FLEET ENEMA 7-19 GM/118ML RE ENEM: 1.0000 | ENEMA | Freq: Every day | RECTAL | Status: DC | PRN

## 2018-12-01 MED ORDER — SOD CITRATE-CITRIC ACID 500-334 MG/5ML PO SOLN: 30.0000 mL | ORAL | Status: DC | PRN

## 2018-12-01 MED ORDER — LACTATED RINGERS IV SOLN
INTRAVENOUS | Status: DC
  Administered 2018-12-01: 08:00:00 via INTRAVENOUS
  Filled 2018-12-01 (×4): qty 1000

## 2018-12-01 MED ORDER — OXYTOCIN BOLUS FROM INFUSION: 500.0000 mL | Freq: Once | INTRAVENOUS | Status: DC

## 2018-12-01 MED ORDER — LACTATED RINGERS IV SOLN: 500.0000 mL | INTRAVENOUS | Status: DC | PRN

## 2018-12-01 MED ORDER — EPHEDRINE 5 MG/ML INJ
10.0000 mg | INTRAVENOUS | Status: DC | PRN
  Filled 2018-12-01: qty 2

## 2018-12-01 MED ORDER — TETANUS-DIPHTH-ACELL PERTUSSIS 5-2.5-18.5 LF-MCG/0.5 IM SUSP: 0.5000 mL | Freq: Once | INTRAMUSCULAR | Status: DC

## 2018-12-01 MED ORDER — SODIUM CHLORIDE 0.9 % IV SOLN: 5.0000 10*6.[IU] | Freq: Once | INTRAVENOUS | Status: DC

## 2018-12-01 MED ORDER — BUTORPHANOL TARTRATE 1 MG/ML IJ SOLN: 1.0000 mg | INTRAMUSCULAR | Status: DC | PRN

## 2018-12-01 MED ORDER — PENICILLIN G 3 MILLION UNITS IVPB - SIMPLE MED
3.0000 10*6.[IU] | INTRAVENOUS | Status: DC
  Administered 2018-12-01 (×2): 3 10*6.[IU] via INTRAVENOUS
  Filled 2018-12-01 (×8): qty 100

## 2018-12-01 MED ORDER — DIPHENHYDRAMINE HCL 50 MG/ML IJ SOLN: 12.5000 mg | INTRAMUSCULAR | Status: DC | PRN

## 2018-12-01 MED ORDER — PHENYLEPHRINE 40 MCG/ML (10ML) SYRINGE FOR IV PUSH (FOR BLOOD PRESSURE SUPPORT)
80.0000 ug | PREFILLED_SYRINGE | INTRAVENOUS | Status: DC | PRN
  Filled 2018-12-01: qty 10

## 2018-12-01 MED ORDER — SIMETHICONE 80 MG PO CHEW: 80.0000 mg | CHEWABLE_TABLET | ORAL | Status: DC | PRN

## 2018-12-01 MED ORDER — DIPHENHYDRAMINE HCL 25 MG PO CAPS: 25.0000 mg | ORAL_CAPSULE | Freq: Four times a day (QID) | ORAL | Status: DC | PRN

## 2018-12-01 MED ORDER — ONDANSETRON HCL 4 MG/2ML IJ SOLN
4.0000 mg | Freq: Four times a day (QID) | INTRAMUSCULAR | Status: DC | PRN
  Administered 2018-12-01: 4 mg via INTRAVENOUS
  Filled 2018-12-01: qty 2

## 2018-12-01 MED ORDER — LEVOTHYROXINE SODIUM 100 MCG PO TABS
200.0000 ug | ORAL_TABLET | Freq: Every day | ORAL | Status: DC
  Administered 2018-12-02 – 2018-12-03 (×2): 200 ug via ORAL
  Filled 2018-12-01 (×2): qty 2

## 2018-12-01 MED ORDER — LACTATED RINGERS IV SOLN: 500.0000 mL | Freq: Once | INTRAVENOUS | Status: DC

## 2018-12-01 MED ORDER — WITCH HAZEL-GLYCERIN EX PADS: 1.0000 "application " | MEDICATED_PAD | CUTANEOUS | Status: DC | PRN

## 2018-12-01 MED ORDER — TERBUTALINE SULFATE 1 MG/ML IJ SOLN
0.2500 mg | Freq: Once | INTRAMUSCULAR | Status: DC | PRN
  Filled 2018-12-01: qty 1

## 2018-12-01 MED ORDER — OXYTOCIN BOLUS FROM INFUSION
500.0000 mL | Freq: Once | INTRAVENOUS | Status: AC
  Administered 2018-12-01: 500 mL via INTRAVENOUS

## 2018-12-01 MED ORDER — COCONUT OIL OIL: 1.0000 "application " | TOPICAL_OIL | Status: DC | PRN

## 2018-12-01 MED ORDER — OXYTOCIN 40 UNITS IN NORMAL SALINE INFUSION - SIMPLE MED
1.0000 m[IU]/min | INTRAVENOUS | Status: DC
  Administered 2018-12-01: 2 m[IU]/min via INTRAVENOUS
  Filled 2018-12-01: qty 1000

## 2018-12-01 MED ORDER — BENZOCAINE-MENTHOL 20-0.5 % EX AERO: 1.0000 "application " | INHALATION_SPRAY | CUTANEOUS | Status: DC | PRN

## 2018-12-01 MED ORDER — PHENYLEPHRINE 40 MCG/ML (10ML) SYRINGE FOR IV PUSH (FOR BLOOD PRESSURE SUPPORT)
PREFILLED_SYRINGE | INTRAVENOUS | Status: AC
  Filled 2018-12-01: qty 10

## 2018-12-01 MED ORDER — ONDANSETRON HCL 4 MG PO TABS: 4.0000 mg | ORAL_TABLET | ORAL | Status: DC | PRN

## 2018-12-01 MED ORDER — PRENATAL MULTIVITAMIN CH
1.0000 | ORAL_TABLET | Freq: Every day | ORAL | Status: DC
  Administered 2018-12-02: 1 via ORAL
  Filled 2018-12-01: qty 1

## 2018-12-01 MED ORDER — ONDANSETRON HCL 4 MG/2ML IJ SOLN: 4.0000 mg | Freq: Four times a day (QID) | INTRAMUSCULAR | Status: DC | PRN

## 2018-12-01 MED ORDER — FENTANYL-BUPIVACAINE-NACL 0.5-0.125-0.9 MG/250ML-% EP SOLN
EPIDURAL | Status: AC
  Filled 2018-12-01: qty 250

## 2018-12-01 MED ORDER — SODIUM CHLORIDE 0.9 % IV SOLN
5.0000 10*6.[IU] | Freq: Once | INTRAVENOUS | Status: AC
  Administered 2018-12-01: 5 10*6.[IU] via INTRAVENOUS
  Filled 2018-12-01: qty 5

## 2018-12-01 MED ORDER — SODIUM BICARBONATE 8.4 % IV SOLN
INTRAVENOUS | Status: DC | PRN
  Administered 2018-12-01 (×2): 5 mL via EPIDURAL

## 2018-12-01 MED ORDER — LACTATED RINGERS IV SOLN
500.0000 mL | INTRAVENOUS | Status: DC | PRN
  Filled 2018-12-01: qty 1000

## 2018-12-01 MED ORDER — DIBUCAINE (PERIANAL) 1 % EX OINT: 1.0000 "application " | TOPICAL_OINTMENT | CUTANEOUS | Status: DC | PRN

## 2018-12-01 MED ORDER — SODIUM CHLORIDE (PF) 0.9 % IJ SOLN
INTRAMUSCULAR | Status: DC | PRN
  Administered 2018-12-01: 12 mL/h via EPIDURAL

## 2018-12-01 MED ORDER — OXYCODONE HCL 5 MG PO TABS: 5.0000 mg | ORAL_TABLET | ORAL | Status: DC | PRN

## 2018-12-01 MED ORDER — SERTRALINE HCL 50 MG PO TABS
50.0000 mg | ORAL_TABLET | Freq: Every day | ORAL | Status: DC
  Administered 2018-12-01 – 2018-12-03 (×3): 50 mg via ORAL
  Filled 2018-12-01 (×3): qty 1

## 2018-12-01 MED ORDER — ZOLPIDEM TARTRATE 5 MG PO TABS: 5.0000 mg | ORAL_TABLET | Freq: Every evening | ORAL | Status: DC | PRN

## 2018-12-01 MED ORDER — SENNOSIDES-DOCUSATE SODIUM 8.6-50 MG PO TABS
2.0000 | ORAL_TABLET | ORAL | Status: DC
  Administered 2018-12-01 – 2018-12-02 (×2): 2 via ORAL
  Filled 2018-12-01 (×2): qty 2

## 2018-12-01 MED ORDER — OXYCODONE HCL 5 MG PO TABS: 10.0000 mg | ORAL_TABLET | ORAL | Status: DC | PRN

## 2018-12-01 NOTE — Progress Notes (Signed)
Patient ID: Sara Wallace, female   DOB: 10-30-1990, 30 y.o.   MRN: [id number]   Comfortable w epidural  AFVSS gen NAD FHTs 125-135 mod var, + accels, category 1 toco q 2-25min  SVE 4/80/per RN  Continue IOL AROM after PCN

## 2018-12-01 NOTE — MAU Note (Signed)
Pt reports to MAU c/o frequent ctx and bloody show. +FM. No LOF.

## 2018-12-01 NOTE — Anesthesia Procedure Notes (Signed)
Epidural Patient location during procedure: OB Start time: 12/01/2018 6:30 AM End time: 12/01/2018 6:35 AM  Staffing Anesthesiologist: Lyn Hollingshead, MD Performed: anesthesiologist   Preanesthetic Checklist Completed: patient identified, site marked, surgical consent, pre-op evaluation, timeout performed, IV checked, risks and benefits discussed and monitors and equipment checked  Epidural Patient position: sitting Prep: site prepped and draped and DuraPrep Patient monitoring: continuous pulse ox and blood pressure Approach: midline Location: L3-L4 Injection technique: LOR air  Needle:  Needle type: Tuohy  Needle gauge: 17 G Needle length: 9 cm and 9 Needle insertion depth: 9 cm Catheter type: closed end flexible Catheter size: 19 Gauge Catheter at skin depth: 15 cm Test dose: negative and Other  Assessment Events: blood not aspirated, injection not painful, no injection resistance, negative IV test and no paresthesia  Additional Notes Reason for block:procedure for pain

## 2018-12-01 NOTE — Anesthesia Preprocedure Evaluation (Signed)
Anesthesia Evaluation  Patient identified by MRN, date of birth, ID band Patient awake    Reviewed: Allergy & Precautions, H&P , NPO status , Patient's Chart, lab work & pertinent test results  Airway Mallampati: III  TM Distance: >3 FB Neck ROM: full    Dental no notable dental hx. (+) Teeth Intact   Pulmonary asthma , former smoker,    Pulmonary exam normal breath sounds clear to auscultation       Cardiovascular negative cardio ROS Normal cardiovascular exam Rhythm:regular Rate:Normal     Neuro/Psych PSYCHIATRIC DISORDERS Anxiety Depression negative neurological ROS     GI/Hepatic negative GI ROS, Neg liver ROS,   Endo/Other  Hypothyroidism Morbid obesity  Renal/GU negative Renal ROS     Musculoskeletal negative musculoskeletal ROS (+)   Abdominal (+) + obese,   Peds  Hematology negative hematology ROS (+)   Anesthesia Other Findings   Reproductive/Obstetrics (+) Pregnancy                             Anesthesia Physical Anesthesia Plan  ASA: III  Anesthesia Plan: Epidural   Post-op Pain Management:    Induction:   PONV Risk Score and Plan:   Airway Management Planned:   Additional Equipment:   Intra-op Plan:   Post-operative Plan:   Informed Consent: I have reviewed the patients History and Physical, chart, labs and discussed the procedure including the risks, benefits and alternatives for the proposed anesthesia with the patient or authorized representative who has indicated his/her understanding and acceptance.       Plan Discussed with:   Anesthesia Plan Comments:         Anesthesia Quick Evaluation

## 2018-12-01 NOTE — Progress Notes (Signed)
Patient ID: Sara Wallace, female   DOB: Nov 22, 1990, 28 y.o.   MRN: [id number]  Still working with epidural, comfortable now.  AF VSS, occ BP 140/79most 110-129/56-82  Gen NAD FHTs 125-135, mod var, + accels, some early/variable decels, category 1 toco Q 1-3 min  AROM for min clear fluid SVE 7.8/90/0-+1  Cont current mgmt

## 2018-12-01 NOTE — Progress Notes (Signed)
Patient ID: Sara Wallace, female   DOB: [birth year], 28 y.o.   MRN: [id number]  Comfortable w epidural.  Some pressure.  AFVSS gen NAD FHTs 135 mod var, + accels, category 1 toco Q 26min  SVE 10/100/+1 - per RN  Will sit up - for 30 min - 1 hr - then start pushing Expect SVD

## 2018-12-01 NOTE — H&P (Signed)
Sara Wallace is a 28 y.o. female presenting for r/o labor. Ctx "in my back" for past three hours bringing tears, +FM. Denies LOF, notes bloody show, feels pain very frequently  OB History    Gravida  1   Para      Term      Preterm      AB      Living        SAB      TAB      Ectopic      Multiple      Live Births             Past Medical History:  Diagnosis Date  . Anxiety   . Asthma    moderate persistent  . Depression   . Hx of varicella   . Hypothyroidism   . PCOS (polycystic ovarian syndrome)   . Thyroid disease    Past Surgical History:  Procedure Laterality Date  . PLANTAR FASCIA RELEASE     Family History: family history includes Arthritis in her mother; Diabetes in her father; Fibromyalgia in her mother; Hypertension in her mother; Scoliosis in her mother. Social History:  reports that she quit smoking about 8 months ago. She has a 3.00 pack-year smoking history. She has never used smokeless tobacco. She reports that she does not drink alcohol or use drugs.     Maternal Diabetes: No Genetic Screening: Normal Maternal Ultrasounds/Referrals: Normal Fetal Ultrasounds or other Referrals:  None Maternal Substance Abuse:  No Significant Maternal Medications:  Meds include: Other: levothyroxine, baby ASA Significant Maternal Lab Results:  Group B Strep positive and Other:  Other Comments:  None  Review of Systems  Constitutional: Negative for chills and fever.  Eyes: Negative for blurred vision.  Respiratory: Negative for shortness of breath.   Cardiovascular: Negative for chest pain, palpitations and leg swelling.  Gastrointestinal: Positive for abdominal pain (w. contractions). Negative for nausea and vomiting.  Neurological: Negative for dizziness, weakness and headaches.  Psychiatric/Behavioral: Negative for suicidal ideas.   Maternal Medical History:  Reason for admission: Nausea.    Dilation: 4 Effacement (%): 80 Station: -2 Exam  by:: Lauren Cox RN  Weight (!) 153.8 kg, last menstrual period 02/26/2018. Exam Physical Exam  Constitutional: She is oriented to person, place, and time. She appears well-developed and well-nourished. No distress.  HENT:  Head: Normocephalic and atraumatic.  Eyes: Pupils are equal, round, and reactive to light.  Neck: Normal range of motion. Neck supple.  Cardiovascular: Normal rate and regular rhythm. Exam reveals no gallop.  No murmur heard. GI: There is abdominal tenderness (w/ ctx). There is no rebound and no guarding.  Genitourinary:    Vagina and uterus normal.   Musculoskeletal: Normal range of motion.  Neurological: She is alert and oriented to person, place, and time.  Skin: Skin is warm and dry.    CE 4/80/-2, bloody show FHR Category 1, baseline 125, TOCO q24m  Prenatal labs: ABO, Rh: O/Positive/-- (03/18 0000) Antibody: Negative (03/18 0000) Rubella: Immune (03/18 0000) RPR: Nonreactive (03/18 0000)  HBsAg: Negative (03/18 0000)  HIV: Non-reactive (03/18 0000)  GBS:   pos  Assessment/Plan: This is a 27yo G1P0 @ 38 6/7 by 8 4/7 scan admitted in early labor. PNC c/b BMI 46 on baby ASA, hypothyroidism on levothyroxine QD< remote h/o asthma (PRN albuterol) and GBS pos. EFW 3900g  Admit to L&D - CEFM, CLD> Desires epidural and hospital circ. Recheck to asses need for pitocin.  Plan for AROM after appropriate GBS PCN ppx   Melida Quitter Sara Wallace 12/01/2018, 5:44 AM

## 2018-12-02 ENCOUNTER — Inpatient Hospital Stay (HOSPITAL_COMMUNITY): Payer: 59

## 2018-12-02 ENCOUNTER — Inpatient Hospital Stay (HOSPITAL_COMMUNITY): Admission: AD | Admit: 2018-12-02 | Payer: 59 | Source: Home / Self Care | Admitting: Obstetrics and Gynecology

## 2018-12-02 LAB — CBC
HCT: 27.3 % — ABNORMAL LOW (ref 36.0–46.0)
Hemoglobin: 9 g/dL — ABNORMAL LOW (ref 12.0–15.0)
MCH: 31.1 pg (ref 26.0–34.0)
MCHC: 33 g/dL (ref 30.0–36.0)
MCV: 94.5 fL (ref 80.0–100.0)
Platelets: 225 10*3/uL (ref 150–400)
RBC: 2.89 MIL/uL — ABNORMAL LOW (ref 3.87–5.11)
RDW: 13.5 % (ref 11.5–15.5)
WBC: 15.3 10*3/uL — ABNORMAL HIGH (ref 4.0–10.5)
nRBC: 0 % (ref 0.0–0.2)

## 2018-12-02 NOTE — Anesthesia Postprocedure Evaluation (Signed)
Anesthesia Post Note  Patient: Sara Wallace  Procedure(s) Performed: AN AD HOC LABOR EPIDURAL     Patient location during evaluation: Mother Baby Anesthesia Type: Epidural Level of consciousness: awake and alert and oriented Pain management: satisfactory to patient Vital Signs Assessment: post-procedure vital signs reviewed and stable Respiratory status: respiratory function stable Cardiovascular status: stable Postop Assessment: no headache, no backache, epidural receding, patient able to bend at knees, no signs of nausea or vomiting and adequate PO intake Anesthetic complications: no    Last Vitals:  Vitals:   12/01/18 2342 12/02/18 0352  BP: (!) 141/80 132/86  Pulse: (!) 115 88  Resp: 20 18  Temp:    SpO2: 100% 100%    Last Pain:  Vitals:   12/02/18 0228  TempSrc:   PainSc: Asleep   Pain Goal: Patients Stated Pain Goal: 0 (12/01/18 0529)                 Katherina Mires
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
## 2018-12-02 NOTE — Lactation Note (Signed)
This note was copied from a baby's chart. Lactation Consultation Note  Patient Name: Sara Wallace WIOXB'D Date: 12/02/2018 Reason for consult: Initial assessment;NICU baby;Maternal endocrine disorder;Early term 37-38.6wks;Primapara;1st time breastfeeding Type of Endocrine Disorder?: PCOS  Visited with mom of a 28 hours old ETI NICU female who is being mostly formula fed at this point. Mom is a P1, she's a [employer] employee and she has already got her [place] in the mail. She reported minimal breast changes during the pregnancy, she also has a history of PCOS.   Revised hand expression with mom, no colostrum observed at this point; but she mentioned that she saw a drop earlier, she had some dried out colostrum on her left breast. Mom's breast are large and soft and her nipples are small and very short shafted; stressed out the importance of pumping not just for the onset of lactogenesis II but also to help evert her nipples.  Her RN has already set up a DEBP, flanges # 24 seem appropriate, mom was using # 27, they're too big. Instructions, cleaning and storage were reviewed, as well as milk storage guidelines for NICU babies. Reviewed pumping schedule and benefits of breastmilk for NICU babies. Mom has only taken baby to breast once, offered a feeding assist in the NICU but mom said that due to baby's respiration problems he can not feed directly at the breast at this time. Asked mom to call for a NICU feeding assist whenever she and baby are ready.  Feeding plan:  1. Encouraged mom to pump at least 8 times/24 hours, ideally every 2-3 hours 2. Hand expression and breast massage prior pumping were also strongly encouraged 3. Once she starts getting drops, she'll take those to her NICU baby following breastmilk storage guidelines  BF brochure, BF resources and NICU booklet were reviewed. Mom reported all questions and concerns were answered, she's aware of [facility] OP services and will call  PRN.   Maternal Data Formula Feeding for Exclusion: Yes Reason for exclusion: Mother's choice to formula and breast feed on admission Has patient been taught Hand Expression?: Yes Does the patient have breastfeeding experience prior to this delivery?: No  Feeding Feeding Type: Formula  LATCH Score                   Interventions Interventions: Breast feeding basics reviewed;Breast massage;Hand express;Breast compression;DEBP  Lactation Tools Discussed/Used Tools: Pump Breast pump type: Double-Electric Breast Pump WIC Program: No Pump Review: Setup, frequency, and cleaning;Milk Storage Initiated by:: RN Date initiated:: 12/02/18   Consult Status Consult Status: PRN Follow-up type: In-patient    Sara Wallace 12/02/2018, 1:34 PM

## 2018-12-02 NOTE — Progress Notes (Signed)
Post Partum Day 1 Subjective: no complaints, up ad lib, voiding, tolerating PO and nl lochia, pain controlled  Objective: Blood pressure 132/86, pulse 88, temperature 98.1 F (36.7 C), temperature source Oral, resp. rate 18, height 5\' 11"  (1.803 m), weight (!) 153.8 kg, last menstrual period 02/26/2018, SpO2 100 %, unknown if currently breastfeeding.  Physical Exam:  General: alert and no distress Lochia: appropriate Uterine Fundus: firm   Recent Labs    12/01/18 0620 12/02/18 0512  HGB 13.4 9.0*  HCT 39.5 27.3*    Assessment/Plan: Plan for discharge tomorrow, Breastfeeding and Lactation consult.  Routine PP care.    LOS: 1 day   Christoph Copelan Bovard-Stuckert 12/02/2018, 10:14 AM

## 2018-12-03 MED ORDER — IBUPROFEN 600 MG PO TABS: 600.0000 mg | ORAL_TABLET | Freq: Four times a day (QID) | ORAL | 1 refills | Status: DC | PRN

## 2018-12-03 MED ORDER — PRENATAL 27-0.8 MG PO TABS: 1.0000 | ORAL_TABLET | Freq: Every day | ORAL | 3 refills | Status: DC

## 2018-12-03 NOTE — Discharge Summary (Signed)
OB Discharge Summary     Patient Name: Sara Wallace DOB: 10-16-1990 MRN: [id number]  Date of admission: 12/01/2018 Delivering MD: Janyth Contes   Date of discharge: 12/03/2018  Admitting diagnosis: 38 WKS, CTX Intrauterine pregnancy: [redacted]w[redacted]d     Secondary diagnosis:  Principal Problem:   SVD (spontaneous vaginal delivery) Active Problems:   Normal pregnancy, third trimester  Additional problems: N/A     Discharge diagnosis: Term Pregnancy Delivered                                                                                                Post partum procedures:N/A  Augmentation: AROM  Complications: None  Hospital course:  Onset of Labor With Vaginal Delivery     28 y.o. yo G1P1001 at [redacted]w[redacted]d was admitted in Active Labor on 12/01/2018. Patient had an uncomplicated labor course as follows:  Membrane Rupture Time/Date: 1:05 PM ,12/01/2018   Intrapartum Procedures: Episiotomy: None [1]                                         Lacerations:  2nd degree [3]  Patient had a delivery of a Viable infant. 12/01/2018  Information for the patient's newborn:  Marlin, Brys [[id number]]  Delivery Method: Vaginal, Spontaneous(Filed from Delivery Summary)     Pateint had an uncomplicated postpartum course.  She is ambulating, tolerating a regular diet, passing flatus, and urinating well. Patient is discharged home in stable condition on 12/03/18.   Physical exam  Vitals:   12/01/18 2342 12/02/18 0352 12/02/18 0930 12/03/18 0617  BP:  132/86 130/72 119/69  Pulse: (!) 115 88 95 79  Resp: 20 18 20 18   Temp:   98 F (36.7 C) 98.2 F (36.8 C)  TempSrc:    Oral  SpO2: 100% 100% 100%   Weight:      Height:       General: alert and no distress Lochia: appropriate Uterine Fundus: firm  Labs: Lab Results  Component Value Date   WBC 15.3 (H) 12/02/2018   HGB 9.0 (L) 12/02/2018   HCT 27.3 (L) 12/02/2018   MCV 94.5 12/02/2018   PLT 225 12/02/2018   CMP Latest Ref  Rng & Units 08/05/2010  Glucose 70 - 99 mg/dL 82  BUN 6 - 23 mg/dL 9  Creatinine 0.4 - 1.2 mg/dL 0.8  Sodium 135 - 145 mEq/L 139  Potassium 3.5 - 5.1 mEq/L 4.2  Chloride 96 - 112 mEq/L 107  CO2 19 - 32 mEq/L 25  Calcium 8.4 - 10.5 mg/dL 9.0  Total Protein 6.0 - 8.3 g/dL 7.5  Total Bilirubin 0.3 - 1.2 mg/dL 0.4  Alkaline Phos 39 - 117 U/L 62  AST 0 - 37 U/L 24  ALT 0 - 35 U/L 25    Discharge instruction: per After Visit Summary and "Baby and Me Booklet".  After visit meds:  Allergies as of 12/03/2018   No Known Allergies     Medication List    STOP taking these  medications   aspirin 81 MG chewable tablet   celecoxib 100 MG capsule Commonly known as: CeleBREX   metFORMIN 500 MG tablet Commonly known as: GLUCOPHAGE     TAKE these medications   albuterol 108 (90 Base) MCG/ACT inhaler Commonly known as: VENTOLIN HFA INHALE 2 PUFFS BY MOUTH INTO LUNGS EVERY 4 HOURS FOR SHORTNESS OF BREATH OR WHEEZING   docusate sodium 100 MG capsule Commonly known as: COLACE Take 100 mg by mouth 2 (two) times daily.   ibuprofen 600 MG tablet Commonly known as: ADVIL Take 1 tablet (600 mg total) by mouth every 6 (six) hours as needed.   levothyroxine 200 MCG tablet Commonly known as: SYNTHROID levothyroxine 200 mcg tablet  Take 1 tablet every day by oral route.   multivitamin-prenatal 27-0.8 MG Tabs tablet Take 1 tablet by mouth daily at 12 noon.   sertraline 50 MG tablet Commonly known as: ZOLOFT Take 50 mg by mouth daily.       Diet: routine diet  Activity: Advance as tolerated. Pelvic rest for 6 weeks.   Outpatient follow up:6 weeks Follow up Appt:No future appointments. Follow up Visit:No follow-ups on file.  Postpartum contraception: Undecided  Newborn Data: Live born female  Birth Weight: 9 lb 13.1 oz (4455 g) APGAR: 9, 9  Newborn Delivery   Birth date/time: 12/01/2018 17:32:00 Delivery type: Vaginal, Spontaneous      Baby Feeding: Bottle and  Breast Disposition:NICU   12/03/2018 Sherian Rein, MD

## 2018-12-03 NOTE — Lactation Note (Signed)
This note was copied from a baby's chart. Lactation Consultation Note  Patient Name: Sara Wallace ALPFX'T Date: 12/03/2018 Reason for consult: Follow-up assessment;NICU baby Randel Books is 71 hours old in the NICU.  Mom is pumping every 3 hours and obtaining drops.  Discussed milk coming to volume and the prevention and treatment of engorgement.  She has a DEBP at home.Baby is still requiring oxygen and not able to go to breast yet.  Reassured mom that we are available to assist with latching when the time comes.  Encouraged to call prn.  Maternal Data    Feeding Feeding Type: Formula  LATCH Score                   Interventions    Lactation Tools Discussed/Used     Consult Status Consult Status: PRN    Ave Filter 12/03/2018, 9:05 AM

## 2018-12-03 NOTE — Progress Notes (Signed)
Post Partum Day 2 Subjective: no complaints, up ad lib, voiding, tolerating PO and nl lochia, pain controlled.  Blood loss anemia  Objective: Blood pressure 119/69, pulse 79, temperature 98.2 F (36.8 C), temperature source Oral, resp. rate 18, height 5\' 11"  (1.803 m), weight (!) 153.8 kg, last menstrual period 02/26/2018, SpO2 100 %, unknown if currently breastfeeding.  Physical Exam:  General: alert and no distress Lochia: appropriate Uterine Fundus: firm   Recent Labs    12/01/18 0620 12/02/18 0512  HGB 13.4 9.0*  HCT 39.5 27.3*    Assessment/Plan: Discharge home, Breastfeeding and Lactation consult.  Routine PP care.  D/C with motrin and PNV - f/u 6wks Baby in NICU, getting abx.     LOS: 2 days   Sara Wallace 12/03/2018, 7:51 AM

## 2018-12-04 ENCOUNTER — Ambulatory Visit: Payer: Self-pay

## 2018-12-04 ENCOUNTER — Encounter (HOSPITAL_COMMUNITY): Payer: Self-pay | Admitting: *Deleted

## 2018-12-04 NOTE — Lactation Note (Signed)
This note was copied from a baby's chart. Lactation Consultation Note  Patient Name: Sara Wallace YHCWC'B Date: 12/04/2018 Reason for consult: NICU baby;Infant weight loss;Maternal endocrine disorder;Primapara;1st time breastfeeding;Follow-up assessment;Mother's request;Early term 37-38.6wks Type of Endocrine Disorder?: PCOS  Visited with a mom of a 58 hours old ETI NICU female; mom called for latch assist and schedule a feeding at 2 pm. LC came to the room but RN told that baby had already had a large feeding a couple of hours ago but he may want to take some more since he's at at lib feedings.  Worked with mom on some hand expression, and it took several minutes to get small needle size droplets of colostrum. After RN changed baby's diaper, LC took baby to the left breast in cross cradle position but he wasn't able to latch, he was very gassy (kept burping) and he was having a bowel movement and passing more gas. After a few attempts, mom decided to try another time, maybe tomorrow because she was due for pumping, parents may be discharge tomorrow. A NS may be helpful to try the next time. An attempt was documented in [place].  Noticed that mom has flat nipples, she also complained about some dryness. Assisted mom with pumping, flanges # 24 seemed appropriate at this time; she could do a # 21 but will try # 24 adding some coconut oil prior pumping. Mom is not getting much colostrum yet, possibly related to Hx of PCOS; she reported moderate breast changes during the pregnancy, breasts got slightly larger but nipples/areola didn't get any darker. She's pumping every 3 hours, but not doing it at night. Stressed out the importance of having at least 8 pumping sessions in 24 hours.  Feeding plan:  1. Encouraged mom to put baby to breast on cues doing STS 2. She'll call for feeding assist, LC will bring a NS # 16-20 in the room just a case 3. She'll continue pumping every 2-3 hours; and add some  breast massage and hand expression to it; 8 pumping sessions/24 hours  Mom reported all questions and concerns were answered; she's aware of [facility] OP services and will call PRN.  Maternal Data    Feeding Feeding Type: Formula  LATCH Score Latch: Repeated attempts needed to sustain latch, nipple held in mouth throughout feeding, stimulation needed to elicit sucking reflex.  Audible Swallowing: None  Type of Nipple: Flat  Comfort (Breast/Nipple): Soft / non-tender  Hold (Positioning): Assistance needed to correctly position infant at breast and maintain latch.  LATCH Score: 5  Interventions Interventions: Breast feeding basics reviewed;Assisted with latch;Skin to skin;Breast massage;Hand express;Breast compression;DEBP;Support pillows;Coconut oil  Lactation Tools Discussed/Used Tools: Pump Breast pump type: Double-Electric Breast Pump   Consult Status Consult Status: Follow-up Date: 12/05/18 Follow-up type: In-patient    Sara Wallace Sara Wallace 12/04/2018, 2:58 PM

## 2018-12-07 ENCOUNTER — Encounter (HOSPITAL_COMMUNITY): Payer: Self-pay | Admitting: *Deleted

## 2018-12-08 MED FILL — SERTRALINE HCL 50 MG TABLET: 50 | 30 days supply | Qty: 30 | Fill #4

## 2019-01-08 MED FILL — SERTRALINE HCL 50 MG TABLET: 50 | 30 days supply | Qty: 30 | Fill #5

## 2019-01-17 DIAGNOSIS — Z3009 Encounter for other general counseling and advice on contraception: Secondary | ICD-10-CM | POA: Diagnosis not present

## 2019-01-17 DIAGNOSIS — Z1389 Encounter for screening for other disorder: Secondary | ICD-10-CM | POA: Diagnosis not present

## 2019-02-13 MED FILL — SERTRALINE HCL 50 MG TABLET: 50 | 30 days supply | Qty: 30 | Fill #6

## 2019-02-28 DIAGNOSIS — R102 Pelvic and perineal pain: Secondary | ICD-10-CM | POA: Diagnosis not present

## 2019-02-28 DIAGNOSIS — E282 Polycystic ovarian syndrome: Secondary | ICD-10-CM | POA: Diagnosis not present

## 2019-02-28 DIAGNOSIS — R309 Painful micturition, unspecified: Secondary | ICD-10-CM | POA: Diagnosis not present

## 2019-03-07 DIAGNOSIS — R102 Pelvic and perineal pain: Secondary | ICD-10-CM | POA: Diagnosis not present

## 2019-03-17 MED FILL — SERTRALINE HCL 50 MG TABLET: 50 | 30 days supply | Qty: 30 | Fill #7

## 2019-03-19 ENCOUNTER — Encounter: Payer: Self-pay | Admitting: Family Medicine

## 2019-04-25 MED FILL — SERTRALINE HCL 50 MG TABLET: 50 | 30 days supply | Qty: 30 | Fill #8

## 2019-06-14 DIAGNOSIS — Z01419 Encounter for gynecological examination (general) (routine) without abnormal findings: Secondary | ICD-10-CM | POA: Diagnosis not present

## 2019-06-14 DIAGNOSIS — J45909 Unspecified asthma, uncomplicated: Secondary | ICD-10-CM | POA: Diagnosis not present

## 2019-06-14 DIAGNOSIS — Z130 Encounter for screening for diseases of the blood and blood-forming organs and certain disorders involving the immune mechanism: Secondary | ICD-10-CM | POA: Diagnosis not present

## 2019-06-14 DIAGNOSIS — Z1389 Encounter for screening for other disorder: Secondary | ICD-10-CM | POA: Diagnosis not present

## 2019-06-14 DIAGNOSIS — E282 Polycystic ovarian syndrome: Secondary | ICD-10-CM | POA: Diagnosis not present

## 2019-06-14 DIAGNOSIS — E039 Hypothyroidism, unspecified: Secondary | ICD-10-CM | POA: Diagnosis not present

## 2019-06-14 DIAGNOSIS — F411 Generalized anxiety disorder: Secondary | ICD-10-CM | POA: Diagnosis not present

## 2019-06-14 DIAGNOSIS — Z6841 Body Mass Index (BMI) 40.0 and over, adult: Secondary | ICD-10-CM | POA: Diagnosis not present

## 2019-06-29 ENCOUNTER — Ambulatory Visit: Payer: 59 | Admitting: Family Medicine

## 2019-07-02 ENCOUNTER — Ambulatory Visit: Payer: 59 | Admitting: Family Medicine

## 2019-07-02 ENCOUNTER — Ambulatory Visit (HOSPITAL_COMMUNITY)
Admission: RE | Admit: 2019-07-02 | Discharge: 2019-07-02 | Disposition: A | Discharge status: Home / Self Care | Payer: 59 | Source: Ambulatory Visit | Attending: Family Medicine | Admitting: Family Medicine

## 2019-07-02 ENCOUNTER — Encounter: Payer: Self-pay | Admitting: Family Medicine

## 2019-07-02 ENCOUNTER — Other Ambulatory Visit: Payer: Self-pay

## 2019-07-02 VITALS — BP 128/78 | Temp 97.5°F | Wt 350.0 lb

## 2019-07-02 DIAGNOSIS — M25532 Pain in left wrist: Secondary | ICD-10-CM

## 2019-07-02 NOTE — Progress Notes (Signed)
Patient ID: Sara Wallace, female    DOB: [birth year], 29 y.o.   MRN: [id number]   Chief Complaint  Patient presents with  . Wrist Pain   Subjective:    Wrist Pain  The pain is present in the left wrist. This is a new problem. The current episode started more than 1 month ago. The problem occurs constantly. The problem has been gradually worsening. Pertinent negatives include no fever.   Pt stating having left wrist pain, thinking she pushed a drawer at work hard on surgical cart and jammed her wrist about 2 months ago.  No previous trauma or injury to that hand/wrist.  No swelling, bruising or redness. No numbness or tingling in arm/hand/fingers. No meds taken for it. Pain only when putting pressure on wrist to push up off bed.   Noticing some difficulty with holding heavy things in the hand, some pain with this.  Medical History Breyon has a past medical history of Anxiety, Asthma, Depression, varicella, Hypothyroidism, PCOS (polycystic ovarian syndrome), SVD (spontaneous vaginal delivery) (12/01/2018), and Thyroid disease.   Outpatient Encounter Medications as of 07/02/2019  Medication Sig  . albuterol (PROVENTIL HFA;VENTOLIN HFA) 108 (90 Base) MCG/ACT inhaler INHALE 2 PUFFS BY MOUTH INTO LUNGS EVERY 4 HOURS FOR SHORTNESS OF BREATH OR WHEEZING  . ibuprofen (ADVIL) 600 MG tablet Take 1 tablet (600 mg total) by mouth every 6 (six) hours as needed.  Marland Kitchen levothyroxine (SYNTHROID, LEVOTHROID) 200 MCG tablet levothyroxine 200 mcg tablet  Take 1 tablet every day by oral route.  . sertraline (ZOLOFT) 50 MG tablet Take 50 mg by mouth daily.  . [DISCONTINUED] docusate sodium (COLACE) 100 MG capsule Take 100 mg by mouth 2 (two) times daily.  . [DISCONTINUED] Prenatal Vit-Fe Fumarate-FA (MULTIVITAMIN-PRENATAL) 27-0.8 MG TABS tablet Take 1 tablet by mouth daily at 12 noon.   Facility-Administered Encounter Medications as of 07/02/2019  Medication  . butorphanol (STADOL) injection 1 mg  .  sodium phosphate (FLEET) 7-19 GM/118ML enema 1 enema     Review of Systems  Constitutional: Negative for fever.  Musculoskeletal: Positive for joint swelling.       +Left wrist pain  Skin: Negative for rash and wound.      Vitals BP 128/78   Temp (!) 97.5 F (36.4 C)   Wt (!) 350 lb (158.8 kg)   BMI 48.82 kg/m   Objective:   Physical Exam Constitutional:      General: She is not in acute distress.    Appearance: Normal appearance.  Pulmonary:     Effort: Pulmonary effort is normal. No respiratory distress.  Musculoskeletal:        General: Tenderness (left wrist) present. No swelling, deformity or signs of injury.     Comments: Normal rom with flex/ext of wrist, and supination/pronation. +ttp scaphoid.  No swelling, erythema, or ecchymosis. NVI, pulses normal, cap refill-nl   Skin:    General: Skin is warm and dry.     Findings: No rash.  Neurological:     General: No focal deficit present.     Mental Status: She is alert and oriented to person, place, and time.     Sensory: No sensory deficit.     Motor: No weakness.  Psychiatric:        Mood and Affect: Mood normal.        Behavior: Behavior normal.      Assessment and Plan   1. Left wrist pain - DG Wrist Complete Left; Future  Ordered xray- wrist. Take tylenol or ibuprofen prn, rest, ice.    F/u prn.

## 2019-08-07 ENCOUNTER — Other Ambulatory Visit: Payer: Self-pay | Admitting: Family Medicine

## 2019-08-07 MED FILL — ALBUTEROL SULFATE HFA 108 (: 108 (90 BAS | 17 days supply | Qty: 18 | Fill #0

## 2019-08-07 NOTE — Telephone Encounter (Signed)
Scheduled 7/20

## 2019-08-07 NOTE — Telephone Encounter (Signed)
Please contact patient to set up appt; then may route back to nurses. Thank you  

## 2019-08-10 DIAGNOSIS — H5203 Hypermetropia, bilateral: Secondary | ICD-10-CM | POA: Diagnosis not present

## 2019-08-10 DIAGNOSIS — H52203 Unspecified astigmatism, bilateral: Secondary | ICD-10-CM | POA: Diagnosis not present

## 2019-09-04 ENCOUNTER — Ambulatory Visit: Payer: 59 | Admitting: Family Medicine

## 2019-11-06 MED FILL — ALBUTEROL SULFATE HFA 108 (: 108 (90 BAS | 17 days supply | Qty: 18 | Fill #1

## 2020-06-25 ENCOUNTER — Encounter (INDEPENDENT_AMBULATORY_CARE_PROVIDER_SITE_OTHER): Payer: Self-pay | Admitting: Bariatrics

## 2020-06-25 ENCOUNTER — Ambulatory Visit (INDEPENDENT_AMBULATORY_CARE_PROVIDER_SITE_OTHER): Payer: 59 | Admitting: Bariatrics

## 2020-06-25 ENCOUNTER — Other Ambulatory Visit: Payer: Self-pay

## 2020-06-25 VITALS — BP 114/76 | HR 77 | Temp 98.0°F | Ht 70.0 in | Wt 332.0 lb

## 2020-06-25 DIAGNOSIS — Z1331 Encounter for screening for depression: Secondary | ICD-10-CM | POA: Diagnosis not present

## 2020-06-25 DIAGNOSIS — R0602 Shortness of breath: Secondary | ICD-10-CM

## 2020-06-25 DIAGNOSIS — E038 Other specified hypothyroidism: Secondary | ICD-10-CM

## 2020-06-25 DIAGNOSIS — E559 Vitamin D deficiency, unspecified: Secondary | ICD-10-CM

## 2020-06-25 DIAGNOSIS — Z6841 Body Mass Index (BMI) 40.0 and over, adult: Secondary | ICD-10-CM

## 2020-06-25 DIAGNOSIS — Z0289 Encounter for other administrative examinations: Secondary | ICD-10-CM

## 2020-06-25 DIAGNOSIS — Z9189 Other specified personal risk factors, not elsewhere classified: Secondary | ICD-10-CM

## 2020-06-25 DIAGNOSIS — J4540 Moderate persistent asthma, uncomplicated: Secondary | ICD-10-CM

## 2020-06-25 DIAGNOSIS — R5383 Other fatigue: Secondary | ICD-10-CM

## 2020-06-25 DIAGNOSIS — R7303 Prediabetes: Secondary | ICD-10-CM | POA: Diagnosis not present

## 2020-06-26 ENCOUNTER — Encounter (INDEPENDENT_AMBULATORY_CARE_PROVIDER_SITE_OTHER): Payer: Self-pay | Admitting: Bariatrics

## 2020-06-26 DIAGNOSIS — E559 Vitamin D deficiency, unspecified: Secondary | ICD-10-CM | POA: Insufficient documentation

## 2020-06-26 DIAGNOSIS — R7303 Prediabetes: Secondary | ICD-10-CM | POA: Insufficient documentation

## 2020-06-26 LAB — LIPID PANEL WITH LDL/HDL RATIO
Cholesterol, Total: 176 mg/dL (ref 100–199)
HDL: 32 mg/dL — ABNORMAL LOW (ref >39)
LDL Chol Calc (NIH): 108 mg/dL — ABNORMAL HIGH (ref 0–99)
LDL/HDL Ratio: 3.4 ratio — ABNORMAL HIGH (ref 0.0–3.2)
Triglycerides: 206 mg/dL — ABNORMAL HIGH (ref 0–149)
VLDL Cholesterol Cal: 36 mg/dL (ref 5–40)

## 2020-06-26 LAB — T3: T3, Total: 108 ng/dL (ref 71–180)

## 2020-06-26 LAB — INSULIN, RANDOM: INSULIN: 24.3 u[IU]/mL (ref 2.6–24.9)

## 2020-06-26 LAB — COMPREHENSIVE METABOLIC PANEL
ALT: 17 IU/L (ref 0–32)
AST: 19 IU/L (ref 0–40)
Albumin/Globulin Ratio: 1.3 (ref 1.2–2.2)
Albumin: 4.3 g/dL (ref 3.8–4.8)
Alkaline Phosphatase: 81 IU/L (ref 44–121)
BUN/Creatinine Ratio: 13 (ref 9–23)
BUN: 10 mg/dL (ref 6–20)
Bilirubin Total: 0.3 mg/dL (ref 0.0–1.2)
CO2: 21 mmol/L (ref 20–29)
Calcium: 8.9 mg/dL (ref 8.7–10.2)
Chloride: 101 mmol/L (ref 96–106)
Creatinine, Ser: 0.79 mg/dL (ref 0.57–1.00)
Globulin, Total: 3.2 g/dL (ref 1.5–4.5)
Glucose: 89 mg/dL (ref 65–99)
Potassium: 4.5 mmol/L (ref 3.5–5.2)
Sodium: 138 mmol/L (ref 134–144)
Total Protein: 7.5 g/dL (ref 6.0–8.5)
eGFR: 102 mL/min/{1.73_m2} (ref >59)

## 2020-06-26 LAB — HEMOGLOBIN A1C
Est. average glucose Bld gHb Est-mCnc: 128 mg/dL
Hgb A1c MFr Bld: 6.1 % — ABNORMAL HIGH (ref 4.8–5.6)

## 2020-06-26 LAB — T4, FREE: Free T4: 0.87 ng/dL (ref 0.82–1.77)

## 2020-06-26 LAB — TSH: TSH: 2.44 u[IU]/mL (ref 0.450–4.500)

## 2020-06-26 LAB — VITAMIN D 25 HYDROXY (VIT D DEFICIENCY, FRACTURES): Vit D, 25-Hydroxy: 19.6 ng/mL — ABNORMAL LOW (ref 30.0–100.0)

## 2020-06-26 NOTE — Progress Notes (Signed)
Chief Complaint:   OBESITY Sara Wallace (MR# [id number]) is a 30 y.o. female who presents for evaluation and treatment of obesity and related comorbidities. Current BMI is Body mass index is 47.64 kg/m. Sara Wallace has been struggling with her weight for many years and has been unsuccessful in either losing weight, maintaining weight loss, or reaching her healthy weight goal.  Sara Wallace does like to cook "somewhat", and she notes obstacles as food prices and time constraints. She craves sweets.  Sara Wallace is currently in the action stage of change and ready to dedicate time achieving and maintaining a healthier weight. Sara Wallace is interested in becoming our patient and working on intensive lifestyle modifications including (but not limited to) diet and exercise for weight loss.  Sara Wallace's habits were reviewed today and are as follows: Her family eats meals together, she thinks her family will eat healthier with her, her desired weight loss is 132 lbs, she started gaining weight after high school, her heaviest weight ever was 362 pounds, she is a picky eater and doesn't like to eat healthier foods, she has significant food cravings issues, she snacks frequently in the evenings, she wakes up frequently in the middle of the night to eat, she skips meals frequently, she is frequently drinking liquids with calories, she frequently makes poor food choices, she has problems with excessive hunger, she frequently eats larger portions than normal and she struggles with emotional eating.  Depression Screen Sara Wallace's Food and Mood (modified PHQ-9) score was 10.  Depression screen PHQ 2/9 06/25/2020  Decreased Interest 1  Down, Depressed, Hopeless 0  PHQ - 2 Score 1  Altered sleeping 3  Tired, decreased energy 3  Change in appetite 3  Feeling bad or failure about yourself  0  Trouble concentrating 0  Moving slowly or fidgety/restless 0  Suicidal thoughts 0  PHQ-9 Score 10  Difficult doing work/chores Not  difficult at all   Subjective:   1. Other fatigue Sara Wallace admits to daytime somnolence and admits to waking up still tired. Patent has a history of symptoms of daytime fatigue and morning headache. Sara Wallace generally gets 6 or 8 hours of sleep per night, and states that she has nightime awakenings. Snoring is present. Apneic episodes are not present. Epworth Sleepiness Score is 10.  2. SOB (shortness of breath) on exertion Sara Wallace notes increasing shortness of breath with exercising and seems to be worsening over time with weight gain. She notes getting out of breath sooner with activity than she used to. This has not gotten worse recently. Sara Wallace denies shortness of breath at rest or orthopnea.  3. Other specified hypothyroidism Sara Wallace is currently taking levothyroxine.  4. Moderate persistent asthma without complication Sara Wallace is using an albuterol inhaler 1 time per week.  5. Pre-diabetes Sara Wallace is not on medications currently.  6. Vitamin D deficiency Sara Wallace is not on Vit D currently.  7. At risk for activity intolerance Sara Wallace is at risk for exercise intolerance due to obesity.  Assessment/Plan:   1. Other fatigue Sara Wallace does feel that her weight is causing her energy to be lower than it should be. Fatigue may be related to obesity, depression or many other causes. Labs will be ordered, and in the meanwhile, Sara Wallace will focus on self care including making healthy food choices, increasing physical activity and focusing on stress reduction.  - EKG 12-Lead - Comprehensive metabolic panel - Hemoglobin A1c - Insulin, random - Lipid Panel With LDL/HDL Ratio - T3 - T4, free -  TSH - VITAMIN D 25 Hydroxy (Vit-D Deficiency, Fractures)  2. SOB (shortness of breath) on exertion Sara Wallace does feel that she gets out of breath more easily that she used to when she exercises. Sara Wallace's shortness of breath appears to be obesity related and exercise induced. She has agreed to work on weight loss  and gradually increase exercise to treat her exercise induced shortness of breath. Will continue to monitor closely.  3. Other specified hypothyroidism We will check labs today. Sara Wallace will follow up as directed. Orders and follow up as documented in patient record.  Counseling . Good thyroid control is important for overall health. Supratherapeutic thyroid levels are dangerous and will not improve weight loss results. . Counseling: The correct way to take levothyroxine is fasting, with water, separated by at least 30 minutes from breakfast, and separated by more than 4 hours from calcium, iron, multivitamins, acid reflux medications (PPIs).   - T3 - T4, free - TSH  4. Moderate persistent asthma without complication Sara Wallace is to continue using her inhaler as needed.  5. Pre-diabetes Sara Wallace will continue to work on weight loss, exercise, and decreasing simple carbohydrates to help decrease the risk of diabetes.   - Comprehensive metabolic panel - Hemoglobin A1c - Insulin, random  6. Vitamin D deficiency Low Vitamin D level contributes to fatigue and are associated with obesity, breast, and colon cancer. We will check labs today. Sara Wallace will follow-up for routine testing of Vitamin D, at least 2-3 times per year to avoid over-replacement.  - VITAMIN D 25 Hydroxy (Vit-D Deficiency, Fractures)  7. Depression screen Sara Wallace had a positive depression screening. Depression is commonly associated with obesity and often results in emotional eating behaviors. We will monitor this closely and work on CBT to help improve the non-hunger eating patterns. Referral to Psychology may be required if no improvement is seen as she continues in our clinic.  8. At risk for activity intolerance Sara Wallace was given approximately 15 minutes of exercise intolerance counseling today. She is 30 y.o. female and has risk factors exercise intolerance including obesity. We discussed intensive lifestyle modifications  today with an emphasis on specific weight loss instructions and strategies. Sara Wallace will slowly increase activity as tolerated.  Repetitive spaced learning was employed today to elicit superior memory formation and behavioral change.  9. Obeity current BMI 47 Sara Wallace is currently in the action stage of change and her goal is to continue with weight loss efforts. I recommend Sara Wallace begin the structured treatment plan as follows:  She has agreed to the Category 3 Plan.  Intentional eating was discussed.  Exercise goals: No exercise has been prescribed at this time.   Behavioral modification strategies: increasing lean protein intake, decreasing simple carbohydrates, increasing vegetables, increasing water intake, decreasing eating out, no skipping meals, meal planning and cooking strategies, keeping healthy foods in the home and planning for success.  She was informed of the importance of frequent follow-up visits to maximize her success with intensive lifestyle modifications for her multiple health conditions. She was informed we would discuss her lab results at her next visit unless there is a critical issue that needs to be addressed sooner. Sara Wallace agreed to keep her next visit at the agreed upon time to discuss these results.  Objective:   Blood pressure 114/76, pulse 77, temperature 98 F (36.7 C), height 5\' 10"  (1.778 m), weight (!) 332 lb (150.6 kg), last menstrual period 04/07/2020, SpO2 97 %, unknown if currently breastfeeding. Body mass index is 47.64 kg/m.  EKG: Normal sinus rhythm, rate 82 BPM.  Indirect Calorimeter completed today shows a VO2 of 276 and a REE of 1918.  Her calculated basal metabolic rate is 3149 thus her basal metabolic rate is worse than expected.  General: Cooperative, alert, well developed, in no acute distress. HEENT: Conjunctivae and lids unremarkable. Cardiovascular: Regular rhythm.  Lungs: Normal work of breathing. Neurologic: No focal deficits.   Lab  Results  Component Value Date   CREATININE 0.8 08/05/2010   BUN 9 08/05/2010   NA 139 08/05/2010   K 4.2 08/05/2010   CL 107 08/05/2010   CO2 25 08/05/2010   Lab Results  Component Value Date   ALT 25 08/05/2010   AST 24 08/05/2010   ALKPHOS 62 08/05/2010   BILITOT 0.4 08/05/2010   No results found for: HGBA1C No results found for: INSULIN Lab Results  Component Value Date   TSH 9.260 (H) 03/11/2016   Lab Results  Component Value Date   CHOL 157 08/05/2010   HDL 31.50 (L) 08/05/2010   LDLCALC 101 (H) 08/05/2010   TRIG 125.0 08/05/2010   CHOLHDL 5 08/05/2010   Lab Results  Component Value Date   WBC 15.3 (H) 12/02/2018   HGB 9.0 (L) 12/02/2018   HCT 27.3 (L) 12/02/2018   MCV 94.5 12/02/2018   PLT 225 12/02/2018   No results found for: IRON, TIBC, FERRITIN  Attestation Statements:   Reviewed by clinician on day of visit: allergies, medications, problem list, medical history, surgical history, family history, social history, and previous encounter notes.   Trude Mcburney, am acting as Energy manager for [employer], DO.  I have reviewed the above documentation for accuracy and completeness, and I agree with the above. Corinna Capra, DO

## 2020-06-30 ENCOUNTER — Encounter (INDEPENDENT_AMBULATORY_CARE_PROVIDER_SITE_OTHER): Payer: Self-pay | Admitting: Bariatrics

## 2020-07-09 ENCOUNTER — Other Ambulatory Visit: Payer: Self-pay

## 2020-07-09 ENCOUNTER — Encounter (INDEPENDENT_AMBULATORY_CARE_PROVIDER_SITE_OTHER): Payer: Self-pay | Admitting: Bariatrics

## 2020-07-09 ENCOUNTER — Ambulatory Visit (INDEPENDENT_AMBULATORY_CARE_PROVIDER_SITE_OTHER): Payer: 59 | Admitting: Bariatrics

## 2020-07-09 VITALS — BP 123/86 | HR 76 | Temp 97.9°F | Ht 70.0 in | Wt 330.0 lb

## 2020-07-09 DIAGNOSIS — R7303 Prediabetes: Secondary | ICD-10-CM

## 2020-07-09 DIAGNOSIS — Z6841 Body Mass Index (BMI) 40.0 and over, adult: Secondary | ICD-10-CM

## 2020-07-09 DIAGNOSIS — Z9189 Other specified personal risk factors, not elsewhere classified: Secondary | ICD-10-CM

## 2020-07-09 DIAGNOSIS — E559 Vitamin D deficiency, unspecified: Secondary | ICD-10-CM

## 2020-07-09 DIAGNOSIS — E786 Lipoprotein deficiency: Secondary | ICD-10-CM

## 2020-07-09 MED ORDER — VITAMIN D (ERGOCALCIFEROL) 1.25 MG (50000 UNIT) PO CAPS
50000.0000 [IU] | ORAL_CAPSULE | ORAL | 0 refills | Status: DC
Start: 2020-07-09 — End: 2020-08-11

## 2020-07-09 MED ORDER — METFORMIN HCL 500 MG PO TABS: 500.0000 mg | ORAL_TABLET | Freq: Every day | ORAL | 0 refills | Status: DC

## 2020-07-12 ENCOUNTER — Other Ambulatory Visit: Payer: Self-pay | Admitting: Family Medicine

## 2020-07-12 ENCOUNTER — Other Ambulatory Visit (HOSPITAL_COMMUNITY): Payer: Self-pay

## 2020-07-15 ENCOUNTER — Encounter (INDEPENDENT_AMBULATORY_CARE_PROVIDER_SITE_OTHER): Payer: Self-pay | Admitting: Bariatrics

## 2020-07-15 ENCOUNTER — Other Ambulatory Visit (HOSPITAL_COMMUNITY): Payer: Self-pay

## 2020-07-15 MED ORDER — ALBUTEROL SULFATE HFA 108 (90 BASE) MCG/ACT IN AERS
INHALATION_SPRAY | RESPIRATORY_TRACT | 1 refills | Status: DC
  Filled 2020-07-15: qty 18, 15d supply, fill #0

## 2020-07-15 NOTE — Progress Notes (Signed)
Chief Complaint:   OBESITY Sara Wallace is here to discuss her progress with her obesity treatment plan along with follow-up of her obesity related diagnoses. Sara Wallace is on the Category 3 Plan and states she is following her eating plan approximately 85% of the time. Sara Wallace states she is doing 0 minutes 0 times per week.  Today's visit was #: 2 Starting weight: 332 lbs Starting date: 06/25/2020 Today's weight: 330 lbs Today's date: 07/09/2020 Total lbs lost to date: 2 Total lbs lost since last in-office visit: 2  Interim History: Sara Wallace is down 2 lbs since her initial visit. She had family issues and less planning. She states that she is doing well with water.   Subjective:   1. Vitamin D deficiency Sara Wallace is not on Vit D, and last level is was 19.6.  2. Pre-diabetes Sara Wallace is not on medications. Last A1c was 6.1 and insulin 24.3.  3. Low HDL (under 40) Sara Wallace's last triglycerides were elevated.  4. At risk for osteoporosis Sara Wallace is at higher risk of osteopenia and osteoporosis due to Vitamin D deficiency.   Assessment/Plan:   1. Vitamin D deficiency Low Vitamin D level contributes to fatigue and are associated with obesity, breast, and colon cancer. Miarose agreed to start prescription Vitamin D 50,000 IU every week with no refills. She will follow-up for routine testing of Vitamin D, at least 2-3 times per year to avoid over-replacement.  - Vitamin D, Ergocalciferol, (DRISDOL) 1.25 MG (50000 UNIT) CAPS capsule; Take 1 capsule (50,000 Units total) by mouth every 7 (seven) days.  Dispense: 4 capsule; Refill: 0  2. Pre-diabetes Sara Wallace agreed to start metformin 500 mg daily with no refills. She will continue to work on weight loss, exercise, and decreasing simple carbohydrates to help decrease the risk of diabetes. We discussed metformin, and handout was given.  - metFORMIN (GLUCOPHAGE) 500 MG tablet; Take 1 tablet (500 mg total) by mouth daily with lunch.  Dispense: 30 tablet;  Refill: 0  3. Low HDL (under 40) Cardiovascular risk and specific lipid/LDL goals reviewed. We discussed several lifestyle modifications today. Sara Wallace will continue to work on diet, increase exercise and weight loss efforts. Orders and follow up as documented in patient record.   Counseling Intensive lifestyle modifications are the first line treatment for this issue. . Dietary changes: Increase soluble fiber. Decrease simple carbohydrates. . Exercise changes: Moderate to vigorous-intensity aerobic activity 150 minutes per week if tolerated. . Lipid-lowering medications: see documented in medical record.  4. At risk for osteoporosis Sara Wallace was given approximately 15 minutes of osteoporosis prevention counseling today. Sara Wallace is at risk for osteopenia and osteoporosis due to her Vitamin D deficiency. She was encouraged to take her Vitamin D and follow her higher calcium diet and increase strengthening exercise to help strengthen her bones and decrease her risk of osteopenia and osteoporosis.  Repetitive spaced learning was employed today to elicit superior memory formation and behavioral change.  5. Obesity, current BMI 47 Sara Wallace is currently in the action stage of change. As such, her goal is to continue with weight loss efforts. She has agreed to the Category 3 Plan.   I reviewed labs from 06/25/2020 with the patient today.  Exercise goals: No exercise has been prescribed at this time.  Behavioral modification strategies: increasing lean protein intake, decreasing simple carbohydrates, increasing vegetables, increasing water intake, decreasing eating out, no skipping meals, meal planning and cooking strategies, keeping healthy foods in the home and planning for success.  Sara Wallace has  agreed to follow-up with our clinic in 2 weeks. She was informed of the importance of frequent follow-up visits to maximize her success with intensive lifestyle modifications for her multiple health conditions.    Objective:   Blood pressure 123/86, pulse 76, temperature 97.9 F (36.6 C), height 5\' 10"  (1.778 m), weight (!) 330 lb (149.7 kg), SpO2 97 %, unknown if currently breastfeeding. Body mass index is 47.35 kg/m.  General: Cooperative, alert, well developed, in no acute distress. HEENT: Conjunctivae and lids unremarkable. Cardiovascular: Regular rhythm.  Lungs: Normal work of breathing. Neurologic: No focal deficits.   Lab Results  Component Value Date   CREATININE 0.79 06/25/2020   BUN 10 06/25/2020   NA 138 06/25/2020   K 4.5 06/25/2020   CL 101 06/25/2020   CO2 21 06/25/2020   Lab Results  Component Value Date   ALT 17 06/25/2020   AST 19 06/25/2020   ALKPHOS 81 06/25/2020   BILITOT 0.3 06/25/2020   Lab Results  Component Value Date   HGBA1C 6.1 (H) 06/25/2020   Lab Results  Component Value Date   INSULIN 24.3 06/25/2020   Lab Results  Component Value Date   TSH 2.440 06/25/2020   Lab Results  Component Value Date   CHOL 176 06/25/2020   HDL 32 (L) 06/25/2020   LDLCALC 108 (H) 06/25/2020   TRIG 206 (H) 06/25/2020   CHOLHDL 5 08/05/2010   Lab Results  Component Value Date   WBC 15.3 (H) 12/02/2018   HGB 9.0 (L) 12/02/2018   HCT 27.3 (L) 12/02/2018   MCV 94.5 12/02/2018   PLT 225 12/02/2018   No results found for: IRON, TIBC, FERRITIN  Attestation Statements:   Reviewed by clinician on day of visit: allergies, medications, problem list, medical history, surgical history, family history, social history, and previous encounter notes.   12/04/2018, am acting as Trude Mcburney for Energy manager, DO.  I have reviewed the above documentation for accuracy and completeness, and I agree with the above. [employer], DO

## 2020-07-21 ENCOUNTER — Other Ambulatory Visit (HOSPITAL_COMMUNITY): Payer: Self-pay

## 2020-07-29 IMAGING — DX DG WRIST COMPLETE 3+V*L*
4 series · 4 of 4 positions shown · non-contrast
Comparison: None.

CLINICAL DATA: Left wrist pain for 2 months without known injury.

EXAM:
LEFT WRIST - COMPLETE 3+ VIEW

[wrist pa]
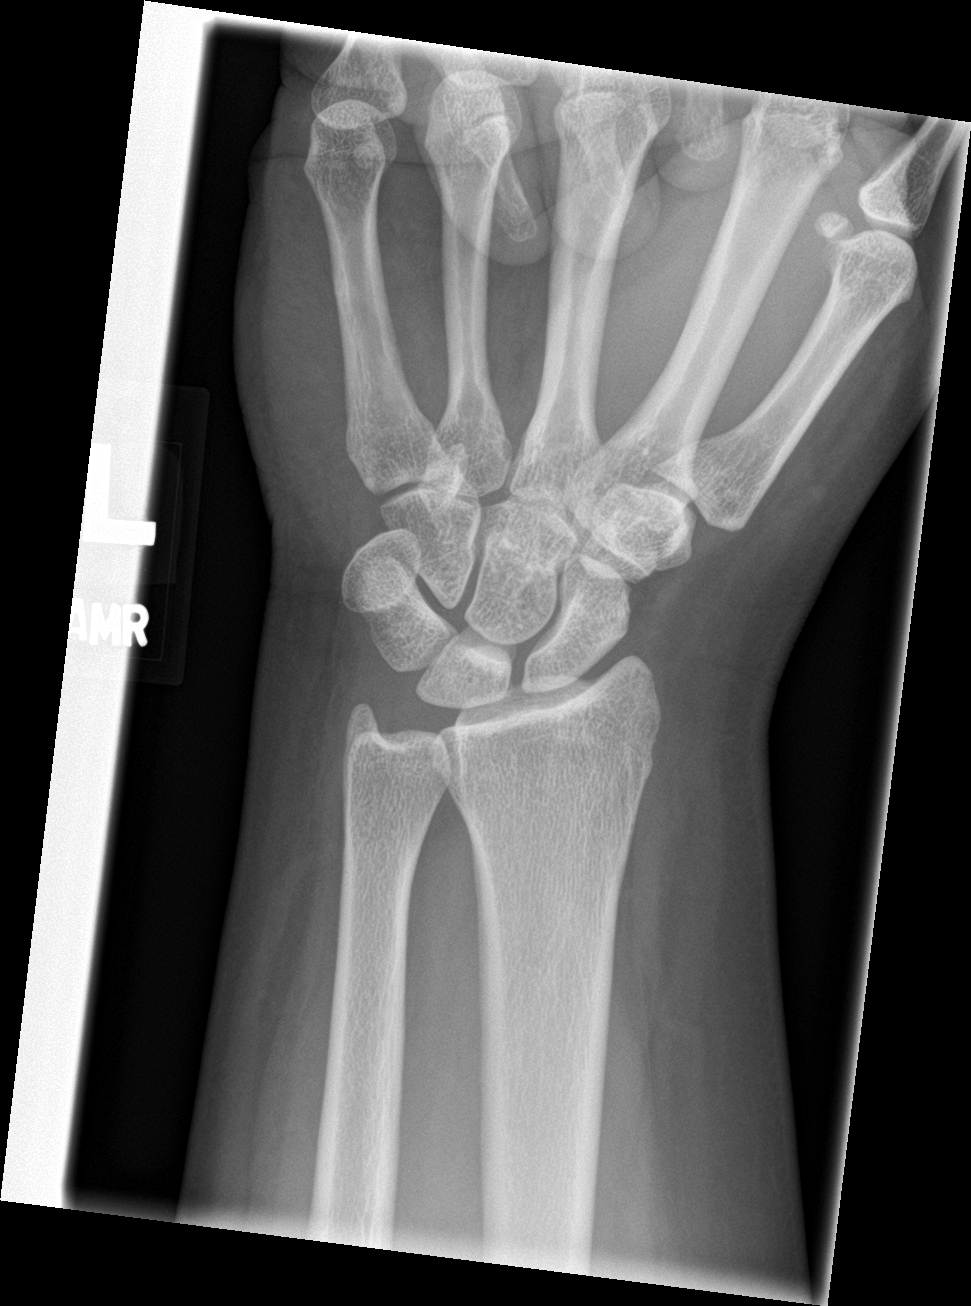

[wrist obl]
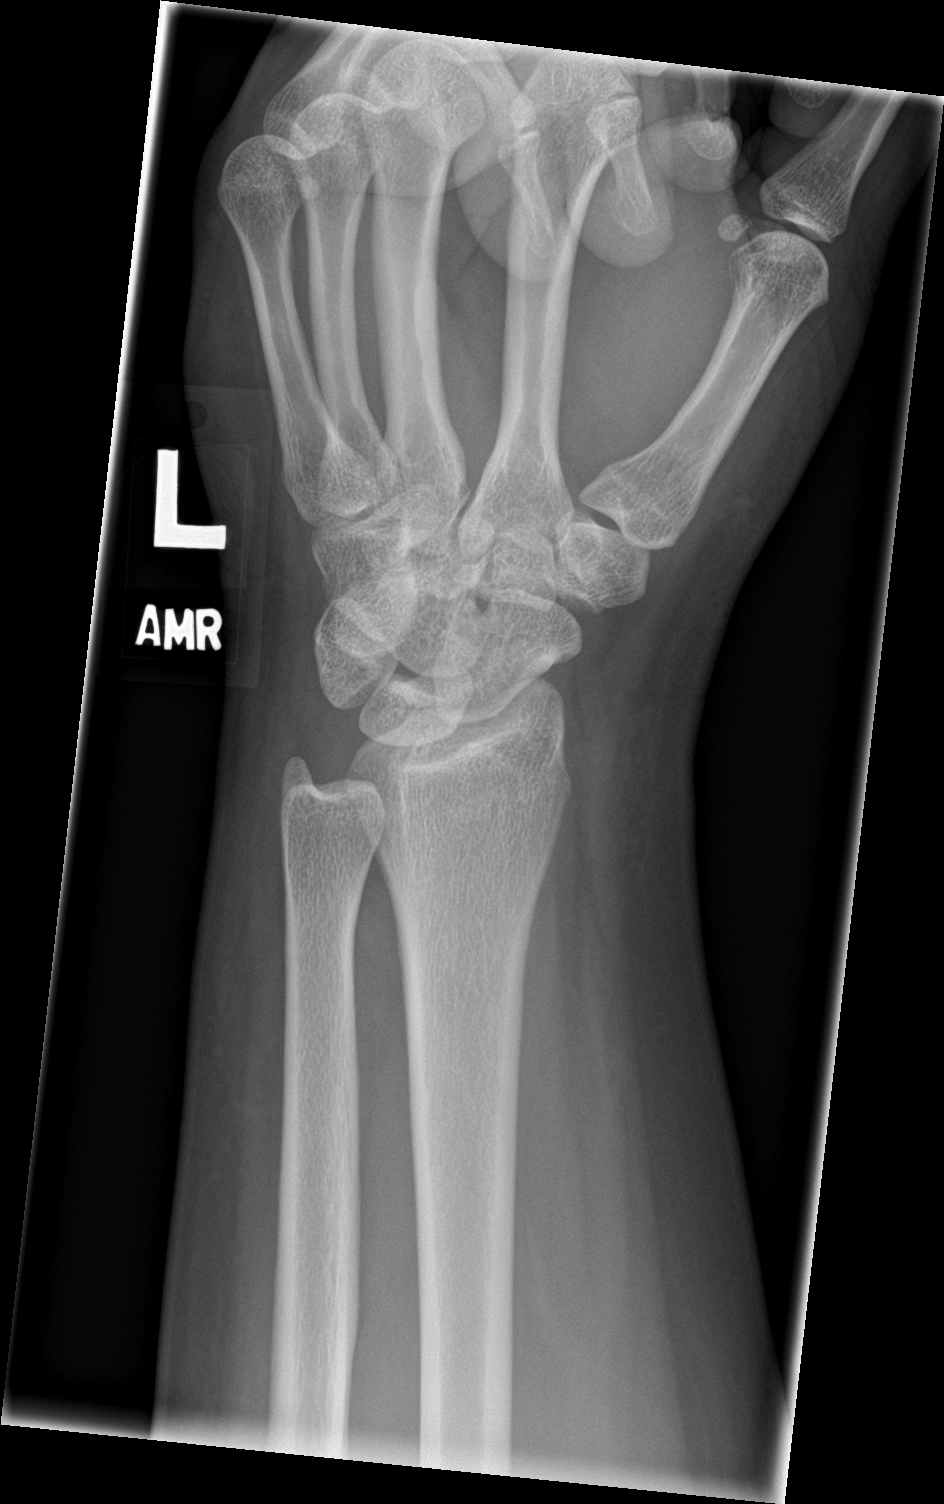

[wrist lat]
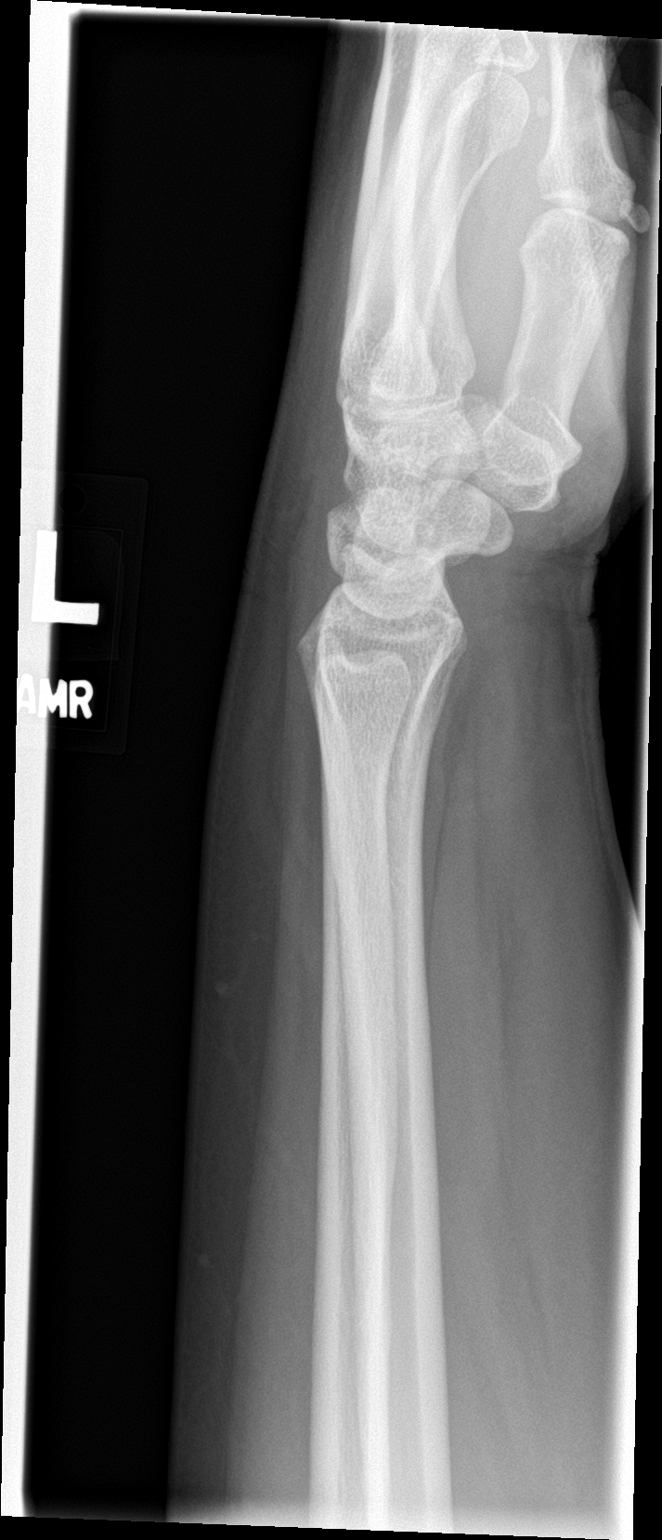

[wrist navicular]
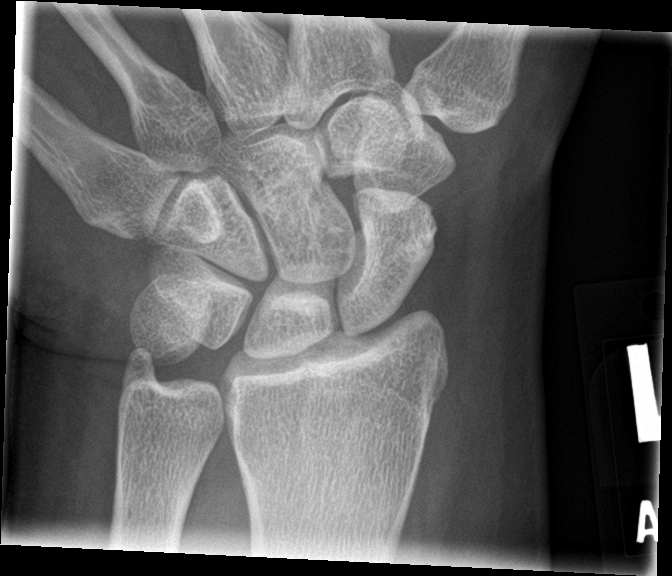

[4 of 4 positions shown; findings below may reference images not displayed]

FINDINGS: There is no evidence of fracture or dislocation. There is no
evidence of arthropathy or other focal bone abnormality. Soft
tissues are unremarkable.
IMPRESSION: Negative.

## 2020-07-30 DIAGNOSIS — N898 Other specified noninflammatory disorders of vagina: Secondary | ICD-10-CM | POA: Diagnosis not present

## 2020-07-30 DIAGNOSIS — Z124 Encounter for screening for malignant neoplasm of cervix: Secondary | ICD-10-CM | POA: Diagnosis not present

## 2020-07-30 DIAGNOSIS — Z1389 Encounter for screening for other disorder: Secondary | ICD-10-CM | POA: Diagnosis not present

## 2020-07-30 DIAGNOSIS — Z6841 Body Mass Index (BMI) 40.0 and over, adult: Secondary | ICD-10-CM | POA: Diagnosis not present

## 2020-07-30 DIAGNOSIS — Z130 Encounter for screening for diseases of the blood and blood-forming organs and certain disorders involving the immune mechanism: Secondary | ICD-10-CM | POA: Diagnosis not present

## 2020-07-30 DIAGNOSIS — Z01419 Encounter for gynecological examination (general) (routine) without abnormal findings: Secondary | ICD-10-CM | POA: Diagnosis not present

## 2020-07-30 DIAGNOSIS — Z1151 Encounter for screening for human papillomavirus (HPV): Secondary | ICD-10-CM | POA: Diagnosis not present

## 2020-07-31 DIAGNOSIS — Z1151 Encounter for screening for human papillomavirus (HPV): Secondary | ICD-10-CM | POA: Diagnosis not present

## 2020-07-31 DIAGNOSIS — Z124 Encounter for screening for malignant neoplasm of cervix: Secondary | ICD-10-CM | POA: Diagnosis not present

## 2020-08-04 ENCOUNTER — Ambulatory Visit (INDEPENDENT_AMBULATORY_CARE_PROVIDER_SITE_OTHER): Payer: 59 | Admitting: Bariatrics

## 2020-08-11 ENCOUNTER — Other Ambulatory Visit: Payer: Self-pay

## 2020-08-11 ENCOUNTER — Other Ambulatory Visit (HOSPITAL_COMMUNITY): Payer: Self-pay

## 2020-08-11 ENCOUNTER — Ambulatory Visit (INDEPENDENT_AMBULATORY_CARE_PROVIDER_SITE_OTHER): Payer: 59 | Admitting: Family Medicine

## 2020-08-11 VITALS — BP 104/69 | HR 88 | Temp 98.1°F | Ht 70.0 in | Wt 318.0 lb

## 2020-08-11 DIAGNOSIS — Z9189 Other specified personal risk factors, not elsewhere classified: Secondary | ICD-10-CM | POA: Diagnosis not present

## 2020-08-11 DIAGNOSIS — R7303 Prediabetes: Secondary | ICD-10-CM | POA: Diagnosis not present

## 2020-08-11 DIAGNOSIS — Z6841 Body Mass Index (BMI) 40.0 and over, adult: Secondary | ICD-10-CM | POA: Diagnosis not present

## 2020-08-11 DIAGNOSIS — E559 Vitamin D deficiency, unspecified: Secondary | ICD-10-CM

## 2020-08-11 DIAGNOSIS — E66813 Obesity, class 3: Secondary | ICD-10-CM

## 2020-08-11 DIAGNOSIS — J4540 Moderate persistent asthma, uncomplicated: Secondary | ICD-10-CM

## 2020-08-11 MED ORDER — VITAMIN D (ERGOCALCIFEROL) 1.25 MG (50000 UNIT) PO CAPS
50000.0000 [IU] | ORAL_CAPSULE | ORAL | 0 refills | Status: DC
  Filled 2020-08-11: qty 4, 28d supply, fill #0

## 2020-08-11 MED ORDER — ALBUTEROL SULFATE HFA 108 (90 BASE) MCG/ACT IN AERS
INHALATION_SPRAY | RESPIRATORY_TRACT | 0 refills | Status: DC
  Filled 2020-08-11: qty 18, 16d supply, fill #0

## 2020-08-11 MED ORDER — METFORMIN HCL 500 MG PO TABS
500.0000 mg | ORAL_TABLET | Freq: Every day | ORAL | 0 refills | Status: DC
  Filled 2020-08-11: qty 30, 30d supply, fill #0

## 2020-08-12 ENCOUNTER — Other Ambulatory Visit (HOSPITAL_COMMUNITY): Payer: Self-pay

## 2020-08-20 NOTE — Progress Notes (Signed)
Chief Complaint:   OBESITY Sara Wallace is here to discuss her progress with her obesity treatment plan along with follow-up of her obesity related diagnoses.   Today's visit was #: 3 Starting weight: 332 lbs Starting date: 06/25/2020 Today's weight: 318 lbs Today's date: 08/11/2020 Weight change since last visit: 12 lbs Total lbs lost to date: 14 lbs Body mass index is 45.63 kg/m.  Total weight loss percentage to date: -4.22%  Interim History:  Sara Wallace is here for a follow up office visit.  We reviewed her meal plan and questions were answered.  Patient's food recall appears to be accurate and consistent with what is on plan when she is following it.   When eating on plan, her hunger and cravings are well controlled.    Sara Wallace says she stuck to the plan better.  At her first office visit, she lost 2 pounds.  This time she lost 12 pounds.  Current Meal Plan: the Category 3 Plan for 85-90% of the time.  Current Exercise Plan: None.  Assessment/Plan:   Medications Discontinued During This Encounter  Medication Reason   Vitamin D, Ergocalciferol, (DRISDOL) 1.25 MG (50000 UNIT) CAPS capsule Reorder   albuterol (VENTOLIN HFA) 108 (90 Base) MCG/ACT inhaler Reorder   metFORMIN (GLUCOPHAGE) 500 MG tablet Reorder   Meds ordered this encounter  Medications   Vitamin D, Ergocalciferol, (DRISDOL) 1.25 MG (50000 UNIT) CAPS capsule    Sig: Take 1 capsule (50,000 Units total) by mouth every 7 (seven) days.    Dispense:  4 capsule    Refill:  0    Ov for rf   albuterol (VENTOLIN HFA) 108 (90 Base) MCG/ACT inhaler    Sig: INHALE 2 PUFFS BY MOUTH INTO LUNGS EVERY 4 HOURS FOR SHORTNESS OF BREATH OR WHEEZING    Dispense:  18 g    Refill:  0   metFORMIN (GLUCOPHAGE) 500 MG tablet    Sig: Take 1 tablet (500 mg total) by mouth daily with lunch.    Dispense:  30 tablet    Refill:  0    1. Pre-diabetes Not at goal. Goal is HgbA1c < 5.7.  Medication: metformin 500 mg daily.  Started on  metformin by Dr. Manson Passey at last office visit.  CMP within normal limits and reviewed by me.  A1c 6.1.  She was previously diagnosed by prediabetes but was never put on medication.  Denies side effects.  Plan: - I reiterated and again counseled patient on pathophysiology of the disease process of prediabetes. - Stressed importance of dietary and lifestyle modifications resulting in weight loss as first line txmnt - in addition we discussed the risks and benefits of various medication options which can help [name] in the management of this disease process as well as with weight loss.  Will consider starting one of these meds in future as will focus on prudent nutritional plan at this time.  - continue to decrease simple carbs; increase fiber and proteins -> follow meal plan  - handouts provided at pt's request after education provided.  All concerns/questions addressed.   - anticipatory guidance given.   - Recheck A1c and fasting insulin level in approximately 3 months from last check or as deemed fit.   Lab Results  Component Value Date   HGBA1C 6.1 (H) 06/25/2020   Lab Results  Component Value Date   INSULIN 24.3 06/25/2020   - Refill metFORMIN (GLUCOPHAGE) 500 MG tablet; Take 1 tablet (500 mg total) by mouth  daily with lunch.  Dispense: 30 tablet; Refill: 0  2. Moderate persistent asthma without complication Using albuterol inhaler 3-4 times per week since COVID infection.    Plan:  Refill albuterol.  Goals for rescue inhaler use reviewed with patient.  Follow-up with PCP if over 2+ times per week of use.  Consider Singulair at next couple of office visits if no improvement in symptoms.  - Refill albuterol (VENTOLIN HFA) 108 (90 Base) MCG/ACT inhaler; INHALE 2 PUFFS BY MOUTH INTO LUNGS EVERY 4 HOURS FOR SHORTNESS OF BREATH OR WHEEZING  Dispense: 18 g; Refill: 0  3. Vitamin D deficiency Not at goal.  She is taking vitamin D 50,000 IU weekly.  Started last office visit.  No issues or concerns.   Tolerating well.  Plan: Continue to take prescription Vitamin D @50 ,000 IU every week as prescribed.  Follow-up for routine testing of Vitamin D, at least 2-3 times per year to avoid over-replacement.  Lab Results  Component Value Date   VD25OH 19.6 (L) 06/25/2020   - Refill Vitamin D, Ergocalciferol, (DRISDOL) 1.25 MG (50000 UNIT) CAPS capsule; Take 1 capsule (50,000 Units total) by mouth every 7 (seven) days.  Dispense: 4 capsule; Refill: 0  4. At risk for diabetes mellitus - Seleen was given diabetes prevention education and counseling today of more than 23 minutes.  - Counseled patient on pathophysiology of disease and meaning/ implication of lab results.  - Reviewed how certain foods can either stimulate or inhibit insulin release, and subsequently affect hunger pathways  - Importance of following a healthy meal plan with limiting amounts of simple carbohydrates discussed with patient - Effects of regular aerobic exercise on blood sugar regulation reviewed and encouraged an eventual goal of 30 min 5d/week or more as a minimum.  - Briefly discussed treatment options, which always include dietary and lifestyle modification as first line.   - Handouts provided at patient's desire and/or told to go online to the American Diabetes Association website for further information.  5. Obesity BMI today 45  Course: Sara Wallace is currently in the action stage of change. As such, her goal is to continue with weight loss efforts.   Nutrition goals: She has agreed to the Category 3 Plan.   Exercise goals:  Sara Wallace wants to start walking some.  Okay to start walking every other day.  Behavioral modification strategies: increasing lean protein intake, decreasing simple carbohydrates, and planning for success.  Sara Wallace has agreed to follow-up with our clinic in 2-3 weeks. She was informed of the importance of frequent follow-up visits to maximize her success with intensive lifestyle modifications for her  multiple health conditions.   Objective:   Blood pressure 104/69, pulse 88, temperature 98.1 F (36.7 C), height 5\' 10"  (1.778 m), weight (!) 318 lb (144.2 kg), SpO2 97 %, unknown if currently breastfeeding. Body mass index is 45.63 kg/m.  General: Cooperative, alert, well developed, in no acute distress. HEENT: Conjunctivae and lids unremarkable. Cardiovascular: Regular rhythm.  Lungs: Normal work of breathing. Neurologic: No focal deficits.   Lab Results  Component Value Date   CREATININE 0.79 06/25/2020   BUN 10 06/25/2020   NA 138 06/25/2020   K 4.5 06/25/2020   CL 101 06/25/2020   CO2 21 06/25/2020   Lab Results  Component Value Date   ALT 17 06/25/2020   AST 19 06/25/2020   ALKPHOS 81 06/25/2020   BILITOT 0.3 06/25/2020   Lab Results  Component Value Date   HGBA1C 6.1 (H) 06/25/2020  Lab Results  Component Value Date   INSULIN 24.3 06/25/2020   Lab Results  Component Value Date   TSH 2.440 06/25/2020   Lab Results  Component Value Date   CHOL 176 06/25/2020   HDL 32 (L) 06/25/2020   LDLCALC 108 (H) 06/25/2020   TRIG 206 (H) 06/25/2020   CHOLHDL 5 08/05/2010   Lab Results  Component Value Date   VD25OH 19.6 (L) 06/25/2020   Lab Results  Component Value Date   WBC 15.3 (H) 12/02/2018   HGB 9.0 (L) 12/02/2018   HCT 27.3 (L) 12/02/2018   MCV 94.5 12/02/2018   PLT 225 12/02/2018   Attestation Statements:   Reviewed by clinician on day of visit: allergies, medications, problem list, medical history, surgical history, family history, social history, and previous encounter notes.  I, Insurance claims handler, CMA, am acting as Energy manager for [employer], DO.  I have reviewed the above documentation for accuracy and completeness, and I agree with the above. Carlye Grippe, D.O.  The 21st Century Cures Act was signed into law in 2016 which includes the topic of electronic health records.  This provides immediate access to information in MyChart.   This includes consultation notes, operative notes, office notes, lab results and pathology reports.  If you have any questions about what you read please let us know at your next visit so we can discuss your concerns and take corrective action if need be.  We are right here with you.

## 2020-09-01 ENCOUNTER — Ambulatory Visit (INDEPENDENT_AMBULATORY_CARE_PROVIDER_SITE_OTHER): Payer: 59 | Admitting: Family Medicine

## 2020-09-10 ENCOUNTER — Other Ambulatory Visit: Payer: Self-pay

## 2020-09-10 ENCOUNTER — Encounter (INDEPENDENT_AMBULATORY_CARE_PROVIDER_SITE_OTHER): Payer: Self-pay | Admitting: Family Medicine

## 2020-09-10 ENCOUNTER — Ambulatory Visit (INDEPENDENT_AMBULATORY_CARE_PROVIDER_SITE_OTHER): Payer: 59 | Admitting: Family Medicine

## 2020-09-10 ENCOUNTER — Other Ambulatory Visit (HOSPITAL_COMMUNITY): Payer: Self-pay

## 2020-09-10 VITALS — BP 95/67 | HR 90 | Temp 98.2°F | Ht 70.0 in | Wt 313.0 lb

## 2020-09-10 DIAGNOSIS — Z6841 Body Mass Index (BMI) 40.0 and over, adult: Secondary | ICD-10-CM

## 2020-09-10 DIAGNOSIS — E559 Vitamin D deficiency, unspecified: Secondary | ICD-10-CM | POA: Diagnosis not present

## 2020-09-10 DIAGNOSIS — R7303 Prediabetes: Secondary | ICD-10-CM | POA: Diagnosis not present

## 2020-09-10 DIAGNOSIS — Z9189 Other specified personal risk factors, not elsewhere classified: Secondary | ICD-10-CM | POA: Diagnosis not present

## 2020-09-10 MED ORDER — METFORMIN HCL 500 MG PO TABS
500.0000 mg | ORAL_TABLET | Freq: Every day | ORAL | 0 refills | Status: DC
  Filled 2020-09-10: qty 30, 30d supply, fill #0

## 2020-09-10 MED ORDER — VITAMIN D (ERGOCALCIFEROL) 1.25 MG (50000 UNIT) PO CAPS
50000.0000 [IU] | ORAL_CAPSULE | ORAL | 0 refills | Status: DC
  Filled 2020-09-10: qty 4, 28d supply, fill #0

## 2020-09-15 NOTE — Progress Notes (Signed)
Chief Complaint:   OBESITY Sara Wallace is here to discuss her progress with her obesity treatment plan along with follow-up of her obesity related diagnoses. Sara Wallace is on the Category 3 Plan and states she is following her eating plan approximately 90% of the time. Sara Wallace states she is not currently exercising.  Today's visit was #: 4 Starting weight: 332 lbs Starting date: 06/25/2020 Today's weight: 313 lbs Today's date: 09/10/2020 Total lbs lost to date: 19 Total lbs lost since last in-office visit: 5  Interim History: Sara Wallace only skipped 2 breakfast meals in the past couple of weeks. She denies hunger. She works with a lot of sweets around her and finds it hard to resist them. She denies issues with plan. She is drinking regular soda occasionally still and not hitting water goals.   Subjective:   1. Pre-diabetes Sara Wallace has sweets cravings at work when she is around them. She is also not taking medications regularly as prescribed. She has a diagnosis of prediabetes based on her elevated HgA1c and was informed this puts her at greater risk of developing diabetes. She continues to work on diet and exercise to decrease her risk of diabetes. She denies nausea or hypoglycemia.  Lab Results  Component Value Date   HGBA1C 6.1 (H) 06/25/2020   Lab Results  Component Value Date   INSULIN 24.3 06/25/2020   2. Vitamin D deficiency She is currently taking prescription vitamin D 50,000 IU each week. She denies nausea, vomiting or muscle weakness.  Lab Results  Component Value Date   VD25OH 19.6 (L) 06/25/2020   3. At risk for dehydration Sara Wallace is at risk for dehydration.  Assessment/Plan:  No orders of the defined types were placed in this encounter.   Medications Discontinued During This Encounter  Medication Reason   levothyroxine (SYNTHROID, LEVOTHROID) 200 MCG tablet Error   Vitamin D, Ergocalciferol, (DRISDOL) 1.25 MG (50000 UNIT) CAPS capsule Reorder   metFORMIN  (GLUCOPHAGE) 500 MG tablet Reorder     Meds ordered this encounter  Medications   metFORMIN (GLUCOPHAGE) 500 MG tablet    Sig: Take 1 tablet (500 mg total) by mouth daily with lunch.    Dispense:  30 tablet    Refill:  0    Ov for RF   Vitamin D, Ergocalciferol, (DRISDOL) 1.25 MG (50000 UNIT) CAPS capsule    Sig: Take 1 capsule (50,000 Units total) by mouth every 7 (seven) days.    Dispense:  4 capsule    Refill:  0    Ov for rf     1. Pre-diabetes Elani will continue to work on weight loss, exercise, and decreasing simple carbohydrates to help decrease the risk of diabetes. Counseling Done.  Refill- metFORMIN (GLUCOPHAGE) 500 MG tablet; Take 1 tablet (500 mg total) by mouth daily with lunch.  Dispense: 30 tablet; Refill: 0  2. Vitamin D deficiency Low Vitamin D level contributes to fatigue and are associated with obesity, breast, and colon cancer. She agrees to continue to take prescription Vitamin D @50 ,000 IU every week and will follow-up for routine testing of Vitamin D, at least 2-3 times per year to avoid over-replacement.  Refill- Vitamin D, Ergocalciferol, (DRISDOL) 1.25 MG (50000 UNIT) CAPS capsule; Take 1 capsule (50,000 Units total) by mouth every 7 (seven) days.  Dispense: 4 capsule; Refill: 0  3. At risk for dehydration Sara Wallace was given approximately 15 minutes dehydration prevention counseling today. Sara Wallace is at risk for dehydration due to weight loss and current  medication(s). She was encouraged to hydrate and monitor fluid status to avoid dehydration as well as weight loss plateaus.    4. Obesity with current BMI of 45.0  Sara Wallace is currently in the action stage of change. As such, her goal is to continue with weight loss efforts. She has agreed to the Category 3 Plan.   Exercise goals:  Start 3 days a week of biking for 10 minutes.  Behavioral modification strategies: avoiding temptations.  Sara Wallace has agreed to follow-up with our clinic in 2 weeks. She was  informed of the importance of frequent follow-up visits to maximize her success with intensive lifestyle modifications for her multiple health conditions.   Objective:   Blood pressure 95/67, pulse 90, temperature 98.2 F (36.8 C), height 5\' 10"  (1.778 m), weight (!) 313 lb (142 kg), SpO2 96 %, unknown if currently breastfeeding. Body mass index is 44.91 kg/m.  General: Cooperative, alert, well developed, in no acute distress. HEENT: Conjunctivae and lids unremarkable. Cardiovascular: Regular rhythm.  Lungs: Normal work of breathing. Neurologic: No focal deficits.   Lab Results  Component Value Date   CREATININE 0.79 06/25/2020   BUN 10 06/25/2020   NA 138 06/25/2020   K 4.5 06/25/2020   CL 101 06/25/2020   CO2 21 06/25/2020   Lab Results  Component Value Date   ALT 17 06/25/2020   AST 19 06/25/2020   ALKPHOS 81 06/25/2020   BILITOT 0.3 06/25/2020   Lab Results  Component Value Date   HGBA1C 6.1 (H) 06/25/2020   Lab Results  Component Value Date   INSULIN 24.3 06/25/2020   Lab Results  Component Value Date   TSH 2.440 06/25/2020   Lab Results  Component Value Date   CHOL 176 06/25/2020   HDL 32 (L) 06/25/2020   LDLCALC 108 (H) 06/25/2020   TRIG 206 (H) 06/25/2020   CHOLHDL 5 08/05/2010   Lab Results  Component Value Date   VD25OH 19.6 (L) 06/25/2020   Lab Results  Component Value Date   WBC 15.3 (H) 12/02/2018   HGB 9.0 (L) 12/02/2018   HCT 27.3 (L) 12/02/2018   MCV 94.5 12/02/2018   PLT 225 12/02/2018    Attestation Statements:   Reviewed by clinician on day of visit: allergies, medications, problem list, medical history, surgical history, family history, social history, and previous encounter notes.  12/04/2018, CMA, am acting as transcriptionist for Edmund Hilda, DO.  I have reviewed the above documentation for accuracy and completeness, and I agree with the above. [employer], D.O.  The 21st Century Cures Act was signed  into law in 2016 which includes the topic of electronic health records.  This provides immediate access to information in MyChart.  This includes consultation notes, operative notes, office notes, lab results and pathology reports.  If you have any questions about what you read please let 2017 know at your next visit so we can discuss your concerns and take corrective action if need be.  We are right here with you.

## 2020-09-24 ENCOUNTER — Other Ambulatory Visit: Payer: Self-pay

## 2020-09-24 ENCOUNTER — Other Ambulatory Visit (HOSPITAL_COMMUNITY): Payer: Self-pay

## 2020-09-24 ENCOUNTER — Encounter (INDEPENDENT_AMBULATORY_CARE_PROVIDER_SITE_OTHER): Payer: Self-pay | Admitting: Family Medicine

## 2020-09-24 ENCOUNTER — Ambulatory Visit (INDEPENDENT_AMBULATORY_CARE_PROVIDER_SITE_OTHER): Payer: 59 | Admitting: Family Medicine

## 2020-09-24 VITALS — BP 116/80 | HR 80 | Temp 98.0°F | Ht 70.0 in | Wt 313.0 lb

## 2020-09-24 DIAGNOSIS — E559 Vitamin D deficiency, unspecified: Secondary | ICD-10-CM

## 2020-09-24 DIAGNOSIS — R7303 Prediabetes: Secondary | ICD-10-CM

## 2020-09-24 DIAGNOSIS — Z9189 Other specified personal risk factors, not elsewhere classified: Secondary | ICD-10-CM

## 2020-09-24 DIAGNOSIS — Z6841 Body Mass Index (BMI) 40.0 and over, adult: Secondary | ICD-10-CM | POA: Diagnosis not present

## 2020-09-24 MED ORDER — VITAMIN D (ERGOCALCIFEROL) 1.25 MG (50000 UNIT) PO CAPS
50000.0000 [IU] | ORAL_CAPSULE | ORAL | 0 refills | Status: DC
  Filled 2020-09-24: qty 4, 28d supply, fill #0

## 2020-09-29 NOTE — Progress Notes (Signed)
Chief Complaint:   OBESITY Sara Wallace is here to discuss her progress with her obesity treatment plan along with follow-up of her obesity related diagnoses. Sara Wallace is on the Category 3 Plan and states she is following her eating plan approximately 85% of the time. Sara Wallace states she is doing 0 minutes 0 times per week.  Today's visit was #: 5 Starting weight: 332 lbs Starting date: 06/25/2020 Today's weight: 313 lbs Today's date: 09/24/2020 Total lbs lost to date: 19 Total lbs lost since last in-office visit: 0  Interim History: Sara Wallace is here for a follow up office visit.  We reviewed her meal plan and questions were answered.  Patient's food recall appears to be accurate and consistent with what is on plan when she is following it.   When eating on plan, her hunger and cravings are well controlled.    Subjective:   1. Vitamin D deficiency Sara Wallace is currently taking prescription vitamin D 50,000 IU each week. She denies nausea, vomiting or muscle weakness.  2. At risk for malnutrition Sara Wallace is at increased risk for malnutrition.  Assessment/Plan:   1. Vitamin D deficiency Low Vitamin D level contributes to fatigue and are associated with obesity, breast, and colon cancer. We will refill prescription Vitamin D for 1 month. Hayle will follow-up for routine testing of Vitamin D, at least 2-3 times per year to avoid over-replacement.  - Vitamin D, Ergocalciferol, (DRISDOL) 1.25 MG (50000 UNIT) CAPS capsule; Take 1 capsule (50,000 Units total) by mouth every 7 (seven) days.  Dispense: 4 capsule; Refill: 0  2. At risk for malnutrition Sara Wallace was given extensive malnutrition prevention education and counseling today of more than 9 minutes. Counseled her that malnutrition refers to inappropriate nutrients or not the right balance of nutrients for optimal health.  Discussed with Sara Wallace that it is absolutely possible to be malnourished but yet obese.  Risk factors, including  but not limited to, inappropriate dietary choices, difficulty with obtaining food due to physical or financial limitations, and various physical and mental health conditions were reviewed with Sara Wallace.   3. Obesity with current BMI 45.0 Sara Wallace is currently in the action stage of change. As such, her goal is to continue with weight loss efforts. She has agreed to the Category 3 Plan.   Exercise goals: As is.  Behavioral modification strategies: increasing lean protein intake, decreasing simple carbohydrates, and avoiding temptations.  Sara Wallace has agreed to follow-up with our clinic in 3 weeks. She was informed of the importance of frequent follow-up visits to maximize her success with intensive lifestyle modifications for her multiple health conditions.   Objective:   Blood pressure 116/80, pulse 80, temperature 98 F (36.7 C), height 5\' 10"  (1.778 m), weight (!) 313 lb (142 kg), SpO2 97 %, unknown if currently breastfeeding. Body mass index is 44.91 kg/m.  General: Cooperative, alert, well developed, in no acute distress. HEENT: Conjunctivae and lids unremarkable. Cardiovascular: Regular rhythm.  Lungs: Normal work of breathing. Neurologic: No focal deficits.   Lab Results  Component Value Date   CREATININE 0.79 06/25/2020   BUN 10 06/25/2020   NA 138 06/25/2020   K 4.5 06/25/2020   CL 101 06/25/2020   CO2 21 06/25/2020   Lab Results  Component Value Date   ALT 17 06/25/2020   AST 19 06/25/2020   ALKPHOS 81 06/25/2020   BILITOT 0.3 06/25/2020   Lab Results  Component Value Date   HGBA1C 6.1 (H)  06/25/2020   Lab Results  Component Value Date   INSULIN 24.3 06/25/2020   Lab Results  Component Value Date   TSH 2.440 06/25/2020   Lab Results  Component Value Date   CHOL 176 06/25/2020   HDL 32 (L) 06/25/2020   LDLCALC 108 (H) 06/25/2020   TRIG 206 (H) 06/25/2020   CHOLHDL 5 08/05/2010   Lab Results  Component Value Date   VD25OH 19.6 (L) 06/25/2020    Lab Results  Component Value Date   WBC 15.3 (H) 12/02/2018   HGB 9.0 (L) 12/02/2018   HCT 27.3 (L) 12/02/2018   MCV 94.5 12/02/2018   PLT 225 12/02/2018   No results found for: IRON, TIBC, FERRITIN  Attestation Statements:   Reviewed by clinician on day of visit: allergies, medications, problem list, medical history, surgical history, family history, social history, and previous encounter notes.   Trude Mcburney, am acting as transcriptionist for [employer], DO.  I have reviewed the above documentation for accuracy and completeness, and I agree with the above. Carlye Grippe, D.O.  The 21st Century Cures Act was signed into law in 2016 which includes the topic of electronic health records.  This provides immediate access to information in MyChart.  This includes consultation notes, operative notes, office notes, lab results and pathology reports.  If you have any questions about what you read please let us know at your next visit so we can discuss your concerns and take corrective action if need be.  We are right here with you.

## 2020-10-22 ENCOUNTER — Other Ambulatory Visit (HOSPITAL_COMMUNITY): Payer: Self-pay

## 2020-10-22 ENCOUNTER — Other Ambulatory Visit: Payer: Self-pay

## 2020-10-22 ENCOUNTER — Encounter: Payer: Self-pay | Admitting: Family Medicine

## 2020-10-22 ENCOUNTER — Ambulatory Visit: Payer: 59 | Admitting: Family Medicine

## 2020-10-22 VITALS — BP 109/75 | HR 89 | Temp 97.9°F | Wt 316.0 lb

## 2020-10-22 DIAGNOSIS — M25551 Pain in right hip: Secondary | ICD-10-CM | POA: Diagnosis not present

## 2020-10-22 DIAGNOSIS — G5602 Carpal tunnel syndrome, left upper limb: Secondary | ICD-10-CM

## 2020-10-22 DIAGNOSIS — M2550 Pain in unspecified joint: Secondary | ICD-10-CM | POA: Diagnosis not present

## 2020-10-22 DIAGNOSIS — M25552 Pain in left hip: Secondary | ICD-10-CM | POA: Diagnosis not present

## 2020-10-22 MED ORDER — NAPROXEN 500 MG PO TABS
500.0000 mg | ORAL_TABLET | Freq: Two times a day (BID) | ORAL | 0 refills | Status: DC
  Filled 2020-10-22: qty 60, 30d supply, fill #0

## 2020-10-22 NOTE — Progress Notes (Signed)
Patient ID: Sara Wallace, female    DOB: 07/19/1990, 30 y.o.   MRN: [id number]   Chief Complaint  Patient presents with   Hip Pain   Subjective:    HPI Pt having right hip pain. Going on about one month. Right hip hurts worse, but sometimes both hips hurts. Family history of RA.  Tingling in hands. Left hand mostly but sometime both. Pt has tried Aleve and muscle relaxer. Also used TENS unit but that made hips hurt worse.  Fatigue going on about one year. Pt aware that fatigue may be due to thyroid but states fatigue has worsened.   Having a low grade temp at night.  Finger tightness at night and numbness in fingers. No joint swelling or redness. No rashes.  [facility] in [place], 210m levothryoxine.  Seeing Dr. ORaliegh Scarlet  Checking thyroid next week again.  Leaning on arms due ot hands numbness and tingling on left fingers.  First 3 fingers.   Tried muscle relaxer and aleve helped some. Flares up and taking then.  Not happnening dialy. Flares up few days ago.   Intermittent with numbness/tingling on left fingers.  Medical History Sara Wallace a past medical history of Anxiety, Asthma, Back pain, Depression, Female infertility, varicella, Hypothyroidism, IBS (irritable bowel syndrome), PCOS (polycystic ovarian syndrome), Prediabetes, SVD (spontaneous vaginal delivery) (12/01/2018), and Thyroid disease.   Outpatient Encounter Medications as of 10/22/2020  Medication Sig   albuterol (VENTOLIN HFA) 108 (90 Base) MCG/ACT inhaler INHALE 2 PUFFS BY MOUTH INTO LUNGS EVERY 4 HOURS FOR SHORTNESS OF BREATH OR WHEEZING   levothyroxine (SYNTHROID) 200 MCG tablet Take 200 mcg by mouth daily before breakfast. Take 1 tablet by mouth in addition to a 230m tablet for a total of 22559mdaily.   levothyroxine (SYNTHROID) 25 MCG tablet Take 25 mcg by mouth daily before breakfast. Take 1 tablet by mouth once daily in addition to 200m51mablet for a total dose of 225mc39maily.   metFORMIN (GLUCOPHAGE) 500 MG tablet Take 1 tablet (500 mg total) by mouth daily with lunch.   naproxen (NAPROSYN) 500 MG tablet Take 1 tablet (500 mg total) by mouth 2 (two) times daily with a meal.   sertraline (ZOLOFT) 50 MG tablet Take 50 mg by mouth daily.   Vitamin D, Ergocalciferol, (DRISDOL) 1.25 MG (50000 UNIT) CAPS capsule Take 1 capsule (50,000 Units total) by mouth every 7 (seven) days.   [DISCONTINUED] amoxicillin (AMOXIL) 500 MG capsule Take 500 mg by mouth every 8 (eight) hours.   No facility-administered encounter medications on file as of 10/22/2020.     Review of Systems  Constitutional:  Negative for chills and fever.  HENT:  Negative for congestion, rhinorrhea and sore throat.   Respiratory:  Negative for cough, shortness of breath and wheezing.   Cardiovascular:  Negative for chest pain and leg swelling.  Gastrointestinal:  Negative for abdominal pain, diarrhea, nausea and vomiting.  Genitourinary:  Negative for dysuria and frequency.  Musculoskeletal:  Positive for arthralgias (rt hip pain, wrist pain). Negative for back pain.  Skin:  Negative for rash.  Neurological:  Positive for numbness (left fingers). Negative for dizziness, weakness and headaches.    Vitals BP 109/75   Pulse 89   Temp 97.9 F (36.6 C)   Wt (!) 316 lb (143.3 kg)   SpO2 97%   BMI 45.34 kg/m   Objective:   Physical Exam Vitals and nursing note reviewed.  Constitutional:  General: She is not in acute distress.    Appearance: Normal appearance. She is obese. She is not ill-appearing.  Pulmonary:     Effort: No respiratory distress.  Musculoskeletal:        General: No swelling, tenderness, deformity or signs of injury. Normal range of motion.     Cervical back: Normal range of motion.     Right lower leg: No edema.     Left lower leg: No edema.     Comments: +rt hip normal rom. No spinous process tenderness, neg SLR bilateral.  No rash, or erythema. +wrist normal rom  on left and right.  Neg phalen's sign. No swelling or erythema in finger joints, wrist or knees. Normal rom of neck/shoulders.  Skin:    General: Skin is warm and dry.     Findings: No erythema, lesion or rash.  Neurological:     General: No focal deficit present.     Mental Status: She is alert and oriented to person, place, and time.     Cranial Nerves: No cranial nerve deficit.     Sensory: No sensory deficit.     Motor: No weakness.     Gait: Gait normal.     Deep Tendon Reflexes: Reflexes normal.  Psychiatric:        Mood and Affect: Mood normal.        Behavior: Behavior normal.     Assessment and Plan   1. Bilateral hip pain - Rheumatoid Factor - ANA - Sed Rate (ESR) - C-reactive protein - CBC With Differential - Uric acid  2. Arthralgia of multiple joints - Rheumatoid Factor - ANA - Sed Rate (ESR) - C-reactive protein - CBC With Differential - Uric acid  3. Carpal tunnel syndrome of left wrist   Multiple joint pains- pt wondering if having RA.  Will order labs.  - may order xray of rt hip if labs are negative.  Start Naproxen. Rest, heat/ice prn. - for wrist- use carpal tunnel wrist splint at night to see if will help with her numbness in fingers.  If worsening or not improving to call or rto. Pt in agreement.   Return if symptoms worsen or fail to improve.

## 2020-10-23 ENCOUNTER — Encounter: Payer: Self-pay | Admitting: Family Medicine

## 2020-10-27 ENCOUNTER — Encounter (INDEPENDENT_AMBULATORY_CARE_PROVIDER_SITE_OTHER): Payer: Self-pay | Admitting: Family Medicine

## 2020-10-27 ENCOUNTER — Other Ambulatory Visit: Payer: Self-pay

## 2020-10-27 ENCOUNTER — Ambulatory Visit (INDEPENDENT_AMBULATORY_CARE_PROVIDER_SITE_OTHER): Payer: 59 | Admitting: Family Medicine

## 2020-10-27 ENCOUNTER — Other Ambulatory Visit (HOSPITAL_COMMUNITY): Payer: Self-pay

## 2020-10-27 VITALS — BP 115/79 | HR 89 | Temp 98.1°F | Ht 70.0 in | Wt 310.0 lb

## 2020-10-27 DIAGNOSIS — E7849 Other hyperlipidemia: Secondary | ICD-10-CM

## 2020-10-27 DIAGNOSIS — Z9189 Other specified personal risk factors, not elsewhere classified: Secondary | ICD-10-CM | POA: Diagnosis not present

## 2020-10-27 DIAGNOSIS — R7303 Prediabetes: Secondary | ICD-10-CM

## 2020-10-27 DIAGNOSIS — E559 Vitamin D deficiency, unspecified: Secondary | ICD-10-CM | POA: Diagnosis not present

## 2020-10-27 DIAGNOSIS — Z6841 Body Mass Index (BMI) 40.0 and over, adult: Secondary | ICD-10-CM | POA: Diagnosis not present

## 2020-10-27 MED ORDER — VITAMIN D (ERGOCALCIFEROL) 1.25 MG (50000 UNIT) PO CAPS
50000.0000 [IU] | ORAL_CAPSULE | ORAL | 0 refills | Status: DC
  Filled 2020-10-27: qty 4, 28d supply, fill #0

## 2020-10-27 MED ORDER — METFORMIN HCL 500 MG PO TABS
ORAL_TABLET | ORAL | 0 refills | Status: DC
  Filled 2020-10-27: qty 60, 30d supply, fill #0

## 2020-10-27 NOTE — Progress Notes (Signed)
Chief Complaint:   OBESITY Sara Wallace is here to discuss her progress with her obesity treatment plan along with follow-up of her obesity related diagnoses. Sara Wallace is on the Category 3 Plan and states she is following her eating plan approximately 50% of the time. Sara Wallace states she is not currently exercising.  Today's visit was #: 6 Starting weight: 332 lbs Starting date: 06/25/2020 Today's weight: 310 lbs Today's date: 10/27/2020 Total lbs lost to date: 22 Total lbs lost since last in-office visit: 3  Interim History: Sara Wallace and her husband recently went to the beach for a week and got sick. She is feeling a lot better now. Pt didn't eat on plan the entire time and it's been hard getting back on track.  Subjective:   1. Vitamin D deficiency She is currently taking prescription vitamin D 50,000 IU each week. She denies nausea, vomiting or muscle weakness.  Lab Results  Component Value Date   VD25OH 19.6 (L) 06/25/2020   2. Pre-diabetes Sara Wallace reports increased carb cravings lately. It is difficult to get on track after vacation.  Lab Results  Component Value Date   HGBA1C 6.1 (H) 06/25/2020   Lab Results  Component Value Date   INSULIN 24.3 06/25/2020   3. Other hyperlipidemia Sara Wallace has hyperlipidemia with elevated triglycerides and low HDL. He has been trying to improve her cholesterol levels with intensive lifestyle modification including a low saturated fat diet, exercise and weight loss. She denies any chest pain, claudication or myalgias.  Lab Results  Component Value Date   ALT 17 06/25/2020   AST 19 06/25/2020   ALKPHOS 81 06/25/2020   BILITOT 0.3 06/25/2020   Lab Results  Component Value Date   CHOL 176 06/25/2020   HDL 32 (L) 06/25/2020   LDLCALC 108 (H) 06/25/2020   TRIG 206 (H) 06/25/2020   CHOLHDL 5 08/05/2010   4. At risk for activity intolerance Sara Wallace is at risk for exercise intolerance due to no previous exercising and  deconditioning.  Assessment/Plan:   Orders Placed This Encounter  Procedures   Hemoglobin A1c   Insulin, random   Comprehensive metabolic panel   Magnesium   Lipid Panel With LDL/HDL Ratio   VITAMIN D 25 Hydroxy (Vit-D Deficiency, Fractures)    Medications Discontinued During This Encounter  Medication Reason   metFORMIN (GLUCOPHAGE) 500 MG tablet Reorder   Vitamin D, Ergocalciferol, (DRISDOL) 1.25 MG (50000 UNIT) CAPS capsule Reorder     Meds ordered this encounter  Medications   metFORMIN (GLUCOPHAGE) 500 MG tablet    Sig: Take one tablet by mouth twice daily.    Dispense:  60 tablet    Refill:  0    30 d supply;  ** OV for RF **   Do not send RF request   Vitamin D, Ergocalciferol, (DRISDOL) 1.25 MG (50000 UNIT) CAPS capsule    Sig: Take 1 capsule (50,000 Units total) by mouth every 7 (seven) days.    Dispense:  4 capsule    Refill:  0    Ov for rf     1. Vitamin D deficiency Low Vitamin D level contributes to fatigue and are associated with obesity, breast, and colon cancer. She agrees to continue to take prescription Vitamin D 50,000 IU every week and will follow-up for routine testing of Vitamin D, at least 2-3 times per year to avoid over-replacement. Check labs at next OV.  Refill- Vitamin D, Ergocalciferol, (DRISDOL) 1.25 MG (50000 UNIT) CAPS capsule; Take 1  capsule (50,000 Units total) by mouth every 7 (seven) days.  Dispense: 4 capsule; Refill: 0  - VITAMIN D 25 Hydroxy (Vit-D Deficiency, Fractures)  2. Pre-diabetes Increase Metformin to BID and check labs at next OV.  Increase and Refill- metFORMIN (GLUCOPHAGE) 500 MG tablet; Take one tablet by mouth twice daily.  Dispense: 60 tablet; Refill: 0  - Hemoglobin A1c - Insulin, random - Comprehensive metabolic panel - Magnesium  3. Other hyperlipidemia Cardiovascular risk and specific lipid/LDL goals reviewed.  We discussed several lifestyle modifications today and Sara Wallace will continue to work on diet,  exercise and weight loss efforts. Orders and follow up as documented in patient record. Check labs at next OV.  Counseling Intensive lifestyle modifications are the first line treatment for this issue. Dietary changes: Increase soluble fiber. Decrease simple carbohydrates. Exercise changes: Moderate to vigorous-intensity aerobic activity 150 minutes per week if tolerated. Lipid-lowering medications: see documented in medical record.  - Lipid Panel With LDL/HDL Ratio  4. At risk for activity intolerance Sara Wallace was given approximately 15 minutes of exercise intolerance counseling today. She is 30 y.o. female and has risk factors exercise intolerance including obesity. We discussed intensive lifestyle modifications today with an emphasis on specific weight loss instructions and strategies. Sara Wallace will slowly increase activity as tolerated.  Repetitive spaced learning was employed today to elicit superior memory formation and behavioral change.  5. Obesity with current BMI of 44.6  Sara Wallace is currently in the action stage of change. As such, her goal is to continue with weight loss efforts. She has agreed to the Category 3 Plan.   Exercise goals:  Start cardio (bike, walk) 3 days a wee 15 minutes each time.  Behavioral modification strategies: increasing lean protein intake, decreasing simple carbohydrates, and planning for success.  Sara Wallace has agreed to follow-up with our clinic in 3 weeks- come fasting for labs. She was informed of the importance of frequent follow-up visits to maximize her success with intensive lifestyle modifications for her multiple health conditions.   Objective:   Blood pressure 115/79, pulse 89, temperature 98.1 F (36.7 C), height 5\' 10"  (1.778 m), weight (!) 310 lb (140.6 kg), SpO2 96 %, unknown if currently breastfeeding. Body mass index is 44.48 kg/m.  General: Cooperative, alert, well developed, in no acute distress. HEENT: Conjunctivae and lids  unremarkable. Cardiovascular: Regular rhythm.  Lungs: Normal work of breathing. Neurologic: No focal deficits.   Lab Results  Component Value Date   CREATININE 0.79 06/25/2020   BUN 10 06/25/2020   NA 138 06/25/2020   K 4.5 06/25/2020   CL 101 06/25/2020   CO2 21 06/25/2020   Lab Results  Component Value Date   ALT 17 06/25/2020   AST 19 06/25/2020   ALKPHOS 81 06/25/2020   BILITOT 0.3 06/25/2020   Lab Results  Component Value Date   HGBA1C 6.1 (H) 06/25/2020   Lab Results  Component Value Date   INSULIN 24.3 06/25/2020   Lab Results  Component Value Date   TSH 2.440 06/25/2020   Lab Results  Component Value Date   CHOL 176 06/25/2020   HDL 32 (L) 06/25/2020   LDLCALC 108 (H) 06/25/2020   TRIG 206 (H) 06/25/2020   CHOLHDL 5 08/05/2010   Lab Results  Component Value Date   VD25OH 19.6 (L) 06/25/2020   Lab Results  Component Value Date   WBC 12.2 (H) 10/22/2020   HGB 12.2 10/22/2020   HCT 38.1 10/22/2020   MCV 75 (L) 10/22/2020  PLT 225 12/02/2018    Attestation Statements:   Reviewed by clinician on day of visit: allergies, medications, problem list, medical history, surgical history, family history, social history, and previous encounter notes.  Edmund Hilda, CMA, am acting as transcriptionist for [employer], DO.  I have reviewed the above documentation for accuracy and completeness, and I agree with the above. Carlye Grippe, D.O.  The 21st Century Cures Act was signed into law in 2016 which includes the topic of electronic health records.  This provides immediate access to information in MyChart.  This includes consultation notes, operative notes, office notes, lab results and pathology reports.  If you have any questions about what you read please let us know at your next visit so we can discuss your concerns and take corrective action if need be.  We are right here with you.

## 2020-10-30 LAB — CBC WITH DIFFERENTIAL
Basophils Absolute: 0.1 10*3/uL (ref 0.0–0.2)
Basos: 1 %
EOS (ABSOLUTE): 0.6 10*3/uL — ABNORMAL HIGH (ref 0.0–0.4)
Eos: 5 %
Hematocrit: 38.1 % (ref 34.0–46.6)
Hemoglobin: 12.2 g/dL (ref 11.1–15.9)
Immature Grans (Abs): 0 10*3/uL (ref 0.0–0.1)
Immature Granulocytes: 0 %
Lymphocytes Absolute: 4.2 10*3/uL — ABNORMAL HIGH (ref 0.7–3.1)
Lymphs: 34 %
MCH: 24 pg — ABNORMAL LOW (ref 26.6–33.0)
MCHC: 32 g/dL (ref 31.5–35.7)
MCV: 75 fL — ABNORMAL LOW (ref 79–97)
Monocytes Absolute: 0.7 10*3/uL (ref 0.1–0.9)
Monocytes: 6 %
Neutrophils Absolute: 6.6 10*3/uL (ref 1.4–7.0)
Neutrophils: 54 %
RBC: 5.08 x10E6/uL (ref 3.77–5.28)
RDW: 15 % (ref 11.7–15.4)
WBC: 12.2 10*3/uL — ABNORMAL HIGH (ref 3.4–10.8)

## 2020-10-30 LAB — RHEUMATOID FACTOR: Rheumatoid fact SerPl-aCnc: 10.4 IU/mL (ref <14.0)

## 2020-10-30 LAB — SEDIMENTATION RATE: Sed Rate: 33 mm/hr — ABNORMAL HIGH (ref 0–32)

## 2020-10-30 LAB — ANA: ANA Titer 1: NEGATIVE

## 2020-10-30 LAB — URIC ACID: Uric Acid: 6.1 mg/dL (ref 2.6–6.2)

## 2020-10-30 LAB — C-REACTIVE PROTEIN: CRP: 5 mg/L (ref 0–10)

## 2020-11-13 DIAGNOSIS — I8312 Varicose veins of left lower extremity with inflammation: Secondary | ICD-10-CM | POA: Diagnosis not present

## 2020-11-13 DIAGNOSIS — I83893 Varicose veins of bilateral lower extremities with other complications: Secondary | ICD-10-CM | POA: Diagnosis not present

## 2020-11-13 DIAGNOSIS — I8311 Varicose veins of right lower extremity with inflammation: Secondary | ICD-10-CM | POA: Diagnosis not present

## 2020-11-13 DIAGNOSIS — I83813 Varicose veins of bilateral lower extremities with pain: Secondary | ICD-10-CM | POA: Diagnosis not present

## 2020-11-19 ENCOUNTER — Ambulatory Visit (INDEPENDENT_AMBULATORY_CARE_PROVIDER_SITE_OTHER): Payer: 59 | Admitting: Family Medicine

## 2020-11-29 ENCOUNTER — Other Ambulatory Visit (HOSPITAL_COMMUNITY): Payer: Self-pay

## 2020-11-29 ENCOUNTER — Other Ambulatory Visit (INDEPENDENT_AMBULATORY_CARE_PROVIDER_SITE_OTHER): Payer: Self-pay | Admitting: Family Medicine

## 2020-11-29 DIAGNOSIS — J4540 Moderate persistent asthma, uncomplicated: Secondary | ICD-10-CM

## 2020-12-01 ENCOUNTER — Other Ambulatory Visit (HOSPITAL_COMMUNITY): Payer: Self-pay

## 2020-12-09 DIAGNOSIS — I83893 Varicose veins of bilateral lower extremities with other complications: Secondary | ICD-10-CM | POA: Diagnosis not present

## 2020-12-15 DIAGNOSIS — I83813 Varicose veins of bilateral lower extremities with pain: Secondary | ICD-10-CM | POA: Diagnosis not present

## 2020-12-15 DIAGNOSIS — I8311 Varicose veins of right lower extremity with inflammation: Secondary | ICD-10-CM | POA: Diagnosis not present

## 2020-12-15 DIAGNOSIS — I8312 Varicose veins of left lower extremity with inflammation: Secondary | ICD-10-CM | POA: Diagnosis not present

## 2020-12-15 DIAGNOSIS — I83893 Varicose veins of bilateral lower extremities with other complications: Secondary | ICD-10-CM | POA: Diagnosis not present

## 2021-01-01 DIAGNOSIS — M25561 Pain in right knee: Secondary | ICD-10-CM | POA: Diagnosis not present

## 2021-05-16 DIAGNOSIS — R1084 Generalized abdominal pain: Secondary | ICD-10-CM | POA: Diagnosis not present

## 2021-05-16 DIAGNOSIS — R52 Pain, unspecified: Secondary | ICD-10-CM | POA: Diagnosis not present

## 2021-05-16 DIAGNOSIS — W19XXXA Unspecified fall, initial encounter: Secondary | ICD-10-CM | POA: Diagnosis not present

## 2021-05-18 ENCOUNTER — Other Ambulatory Visit (HOSPITAL_COMMUNITY): Payer: Self-pay

## 2021-05-18 MED ORDER — TRANEXAMIC ACID 650 MG PO TABS
ORAL_TABLET | ORAL | 1 refills | Status: DC
  Filled 2021-05-18: qty 30, 5d supply, fill #0
  Filled 2021-08-23: qty 30, 5d supply, fill #1

## 2021-05-21 ENCOUNTER — Other Ambulatory Visit (HOSPITAL_COMMUNITY): Payer: Self-pay

## 2021-05-21 DIAGNOSIS — Z6841 Body Mass Index (BMI) 40.0 and over, adult: Secondary | ICD-10-CM | POA: Diagnosis not present

## 2021-05-21 DIAGNOSIS — Z713 Dietary counseling and surveillance: Secondary | ICD-10-CM | POA: Diagnosis not present

## 2021-05-21 MED ORDER — WEGOVY 0.5 MG/0.5ML ~~LOC~~ SOAJ
SUBCUTANEOUS | 0 refills | Status: DC
  Filled 2021-05-21 – 2021-06-17 (×5): qty 2, 28d supply, fill #0

## 2021-05-27 ENCOUNTER — Other Ambulatory Visit (HOSPITAL_COMMUNITY): Payer: Self-pay

## 2021-05-27 DIAGNOSIS — E282 Polycystic ovarian syndrome: Secondary | ICD-10-CM | POA: Diagnosis not present

## 2021-05-27 DIAGNOSIS — E039 Hypothyroidism, unspecified: Secondary | ICD-10-CM | POA: Diagnosis not present

## 2021-05-27 DIAGNOSIS — E669 Obesity, unspecified: Secondary | ICD-10-CM | POA: Diagnosis not present

## 2021-05-27 MED ORDER — LEVOTHYROXINE SODIUM 125 MCG PO TABS
ORAL_TABLET | ORAL | 6 refills | Status: DC
  Filled 2021-05-27: qty 60, 30d supply, fill #0
  Filled 2021-06-28: qty 60, 30d supply, fill #1
  Filled 2021-07-13: qty 60, 30d supply, fill #2

## 2021-05-28 ENCOUNTER — Other Ambulatory Visit (HOSPITAL_COMMUNITY): Payer: Self-pay

## 2021-06-05 ENCOUNTER — Other Ambulatory Visit (HOSPITAL_COMMUNITY): Payer: Self-pay

## 2021-06-08 ENCOUNTER — Other Ambulatory Visit (HOSPITAL_COMMUNITY): Payer: Self-pay

## 2021-06-17 ENCOUNTER — Other Ambulatory Visit (HOSPITAL_COMMUNITY): Payer: Self-pay

## 2021-06-18 ENCOUNTER — Other Ambulatory Visit (HOSPITAL_COMMUNITY): Payer: Self-pay

## 2021-06-29 ENCOUNTER — Other Ambulatory Visit (HOSPITAL_COMMUNITY): Payer: Self-pay

## 2021-07-02 ENCOUNTER — Other Ambulatory Visit (HOSPITAL_COMMUNITY): Payer: Self-pay

## 2021-07-02 DIAGNOSIS — Z713 Dietary counseling and surveillance: Secondary | ICD-10-CM | POA: Diagnosis not present

## 2021-07-02 DIAGNOSIS — Z6841 Body Mass Index (BMI) 40.0 and over, adult: Secondary | ICD-10-CM | POA: Diagnosis not present

## 2021-07-02 MED ORDER — WEGOVY 1.7 MG/0.75ML ~~LOC~~ SOAJ
SUBCUTANEOUS | 1 refills | Status: DC
  Filled 2021-07-30: qty 3, 28d supply, fill #0

## 2021-07-02 MED ORDER — WEGOVY 1 MG/0.5ML ~~LOC~~ SOAJ
SUBCUTANEOUS | 0 refills | Status: DC
  Filled 2021-07-02: qty 2, 28d supply, fill #0

## 2021-07-14 ENCOUNTER — Other Ambulatory Visit (HOSPITAL_COMMUNITY): Payer: Self-pay

## 2021-07-15 ENCOUNTER — Other Ambulatory Visit (HOSPITAL_COMMUNITY): Payer: Self-pay

## 2021-07-15 DIAGNOSIS — E039 Hypothyroidism, unspecified: Secondary | ICD-10-CM | POA: Diagnosis not present

## 2021-07-17 ENCOUNTER — Other Ambulatory Visit (HOSPITAL_COMMUNITY): Payer: Self-pay

## 2021-07-17 MED ORDER — LEVOTHYROXINE SODIUM 125 MCG PO TABS
ORAL_TABLET | ORAL | 6 refills | Status: DC
  Filled 2021-07-17 – 2021-07-20 (×2): qty 48, 28d supply, fill #0
  Filled 2021-08-16: qty 48, 28d supply, fill #1

## 2021-07-20 ENCOUNTER — Other Ambulatory Visit (HOSPITAL_COMMUNITY): Payer: Self-pay

## 2021-07-30 ENCOUNTER — Other Ambulatory Visit (HOSPITAL_COMMUNITY): Payer: Self-pay

## 2021-08-11 ENCOUNTER — Other Ambulatory Visit (HOSPITAL_COMMUNITY): Payer: Self-pay

## 2021-08-11 DIAGNOSIS — Z1389 Encounter for screening for other disorder: Secondary | ICD-10-CM | POA: Diagnosis not present

## 2021-08-11 DIAGNOSIS — Z130 Encounter for screening for diseases of the blood and blood-forming organs and certain disorders involving the immune mechanism: Secondary | ICD-10-CM | POA: Diagnosis not present

## 2021-08-11 DIAGNOSIS — Z01419 Encounter for gynecological examination (general) (routine) without abnormal findings: Secondary | ICD-10-CM | POA: Diagnosis not present

## 2021-08-11 DIAGNOSIS — E282 Polycystic ovarian syndrome: Secondary | ICD-10-CM | POA: Diagnosis not present

## 2021-08-11 MED ORDER — METFORMIN HCL ER 500 MG PO TB24
500.0000 mg | ORAL_TABLET | Freq: Two times a day (BID) | ORAL | 4 refills | Status: AC
  Filled 2021-08-11: qty 60, 30d supply, fill #0

## 2021-08-11 MED ORDER — LETROZOLE 2.5 MG PO TABS
5.0000 mg | ORAL_TABLET | Freq: Every day | ORAL | 0 refills | Status: DC
  Filled 2021-08-11: qty 10, 5d supply, fill #0

## 2021-08-17 ENCOUNTER — Other Ambulatory Visit (HOSPITAL_COMMUNITY): Payer: Self-pay

## 2021-08-24 ENCOUNTER — Other Ambulatory Visit (HOSPITAL_COMMUNITY): Payer: Self-pay

## 2021-08-26 DIAGNOSIS — E039 Hypothyroidism, unspecified: Secondary | ICD-10-CM | POA: Diagnosis not present

## 2021-08-27 ENCOUNTER — Other Ambulatory Visit (HOSPITAL_COMMUNITY): Payer: Self-pay

## 2021-08-27 MED ORDER — LEVOTHYROXINE SODIUM 200 MCG PO TABS
ORAL_TABLET | ORAL | 6 refills | Status: AC
  Filled 2021-08-27 – 2021-09-15 (×2): qty 30, 30d supply, fill #0
  Filled 2021-10-13: qty 30, 30d supply, fill #1
  Filled 2021-11-17: qty 30, 30d supply, fill #2
  Filled 2021-12-18: qty 30, 30d supply, fill #3
  Filled 2022-01-16: qty 30, 30d supply, fill #4
  Filled 2022-02-21 – 2022-02-24 (×2): qty 30, 30d supply, fill #5

## 2021-08-31 ENCOUNTER — Other Ambulatory Visit (HOSPITAL_COMMUNITY): Payer: Self-pay

## 2021-09-01 ENCOUNTER — Other Ambulatory Visit (HOSPITAL_COMMUNITY): Payer: Self-pay

## 2021-09-02 DIAGNOSIS — J358 Other chronic diseases of tonsils and adenoids: Secondary | ICD-10-CM | POA: Diagnosis not present

## 2021-09-02 DIAGNOSIS — Z6841 Body Mass Index (BMI) 40.0 and over, adult: Secondary | ICD-10-CM | POA: Diagnosis not present

## 2021-09-02 DIAGNOSIS — J029 Acute pharyngitis, unspecified: Secondary | ICD-10-CM | POA: Diagnosis not present

## 2021-09-09 ENCOUNTER — Other Ambulatory Visit (HOSPITAL_COMMUNITY): Payer: Self-pay

## 2021-09-11 ENCOUNTER — Other Ambulatory Visit (HOSPITAL_COMMUNITY): Payer: Self-pay

## 2021-09-15 ENCOUNTER — Other Ambulatory Visit (HOSPITAL_COMMUNITY): Payer: Self-pay

## 2021-09-23 ENCOUNTER — Encounter (INDEPENDENT_AMBULATORY_CARE_PROVIDER_SITE_OTHER): Payer: Self-pay

## 2021-09-25 ENCOUNTER — Other Ambulatory Visit (HOSPITAL_COMMUNITY): Payer: Self-pay

## 2021-09-25 MED ORDER — TRANEXAMIC ACID 650 MG PO TABS
ORAL_TABLET | ORAL | 12 refills | Status: DC
  Filled 2021-09-25: qty 30, 5d supply, fill #0

## 2021-10-13 DIAGNOSIS — N926 Irregular menstruation, unspecified: Secondary | ICD-10-CM | POA: Diagnosis not present

## 2021-10-14 ENCOUNTER — Other Ambulatory Visit (HOSPITAL_COMMUNITY): Payer: Self-pay

## 2021-10-16 ENCOUNTER — Other Ambulatory Visit (HOSPITAL_COMMUNITY): Payer: Self-pay

## 2021-10-16 ENCOUNTER — Ambulatory Visit: Payer: 59 | Admitting: Family Medicine

## 2021-10-16 VITALS — BP 122/78 | HR 90 | Temp 98.2°F | Ht 70.0 in | Wt 306.0 lb

## 2021-10-16 DIAGNOSIS — Z862 Personal history of diseases of the blood and blood-forming organs and certain disorders involving the immune mechanism: Secondary | ICD-10-CM

## 2021-10-16 DIAGNOSIS — R7303 Prediabetes: Secondary | ICD-10-CM

## 2021-10-16 DIAGNOSIS — E782 Mixed hyperlipidemia: Secondary | ICD-10-CM

## 2021-10-16 DIAGNOSIS — E038 Other specified hypothyroidism: Secondary | ICD-10-CM | POA: Diagnosis not present

## 2021-10-16 DIAGNOSIS — J4540 Moderate persistent asthma, uncomplicated: Secondary | ICD-10-CM | POA: Diagnosis not present

## 2021-10-16 MED ORDER — ALBUTEROL SULFATE HFA 108 (90 BASE) MCG/ACT IN AERS
INHALATION_SPRAY | RESPIRATORY_TRACT | 0 refills | Status: DC
  Filled 2021-10-16: qty 6.7, 16d supply, fill #0

## 2021-10-16 MED ORDER — LETROZOLE 2.5 MG PO TABS: 5.0000 mg | ORAL_TABLET | Freq: Every day | ORAL | 0 refills | Status: AC

## 2021-10-16 MED ORDER — SERTRALINE HCL 100 MG PO TABS
ORAL_TABLET | ORAL | 2 refills | Status: DC
  Filled 2021-10-16: qty 90, 90d supply, fill #0

## 2021-10-16 MED ORDER — TRANEXAMIC ACID 650 MG PO TABS: ORAL_TABLET | ORAL | 12 refills | Status: AC

## 2021-10-16 NOTE — Assessment & Plan Note (Signed)
TSH ordered.  Continue current dosing.

## 2021-10-16 NOTE — Assessment & Plan Note (Addendum)
Stable.  Albuterol refilled. ?

## 2021-10-16 NOTE — Assessment & Plan Note (Signed)
A1c ordered.  Continue metformin.

## 2021-10-16 NOTE — Progress Notes (Signed)
Subjective:  Patient ID: Sara Wallace, female    DOB: [birth year]  Age: 31 y.o. MRN: [id number]  CC: Chief Complaint  Patient presents with   Establish Care    Currently under obgyn care for fertility     HPI:  31 year old female who is actively trying to get pregnant presents to establish care with me.  Patient has hypothyroidism.  Followed by endocrinology.  Currently on 200 mcg of Synthroid.  No current labs available.  Patient suffers from asthma.  She would like a refill on her albuterol.  Stable currently.  Patient is prediabetic.  Doing well on metformin 500 mg twice daily.  Patient Active Problem List   Diagnosis Date Noted   Mixed hyperlipidemia 10/16/2021   Prediabetes 06/26/2020   Vitamin D deficiency 06/26/2020   Asthma, moderate persistent 11/04/2011   Hypothyroidism 08/05/2010    Social Hx   Social History   Socioeconomic History   Marital status: Married    Spouse name: Gaspar Bidding   Number of children: Not on file   Years of education: Not on file   Highest education level: Not on file  Occupational History   Occupation: Surgical Tech  Tobacco Use   Smoking status: Former    Packs/day: 0.50    Years: 6.00    Total pack years: 3.00    Types: Cigarettes    Quit date: 03/19/2018    Years since quitting: 3.5   Smokeless tobacco: Never  Vaping Use   Vaping Use: Never used  Substance and Sexual Activity   Alcohol use: No   Drug use: No   Sexual activity: Yes  Other Topics Concern   Not on file  Social History Narrative   Hh of 2 lives with mom Barbee Shropshire  Pet dog   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  Cashier   Neg ets firearms   Has Bf 1 partener   Social Determinants of Radio broadcast assistant Strain: Not on file  Food Insecurity: Not on file  Transportation Needs: Not on file  Physical Activity: Not on file  Stress: Not on file  Social Connections: Not on file    Review of Systems  Constitutional: Negative.    Respiratory: Negative.    Cardiovascular: Negative.    Objective:  BP 122/78   Pulse 90   Temp 98.2 F (36.8 C)   Ht '5\' 10"'  (1.778 m)   Wt (!) 306 lb (138.8 kg)   SpO2 97%   BMI 43.91 kg/m      10/16/2021    9:45 AM 10/27/2020    9:00 AM 10/22/2020    3:26 PM  BP/Weight  Systolic BP 902 409 735  Diastolic BP 78 79 75  Wt. (Lbs) 306 310 316  BMI 43.91 kg/m2 44.48 kg/m2 45.34 kg/m2    Physical Exam Vitals and nursing note reviewed.  Constitutional:      General: She is not in acute distress.    Appearance: Normal appearance. She is obese. She is not ill-appearing.  HENT:     Head: Normocephalic and atraumatic.  Eyes:     General:        Right eye: No discharge.        Left eye: No discharge.     Conjunctiva/sclera: Conjunctivae normal.  Cardiovascular:     Rate and Rhythm: Normal rate and regular rhythm.  Pulmonary:     Effort: Pulmonary effort is normal.  Breath sounds: Normal breath sounds. No wheezing, rhonchi or rales.  Abdominal:     Palpations: Abdomen is soft.     Tenderness: There is no abdominal tenderness.  Neurological:     Mental Status: She is alert.  Psychiatric:        Mood and Affect: Mood normal.        Behavior: Behavior normal.     Lab Results  Component Value Date   WBC 12.2 (H) 10/22/2020   HGB 12.2 10/22/2020   HCT 38.1 10/22/2020   PLT 225 12/02/2018   GLUCOSE 89 06/25/2020   CHOL 176 06/25/2020   TRIG 206 (H) 06/25/2020   HDL 32 (L) 06/25/2020   LDLCALC 108 (H) 06/25/2020   ALT 17 06/25/2020   AST 19 06/25/2020   NA 138 06/25/2020   K 4.5 06/25/2020   CL 101 06/25/2020   CREATININE 0.79 06/25/2020   BUN 10 06/25/2020   CO2 21 06/25/2020   TSH 2.440 06/25/2020   HGBA1C 6.1 (H) 06/25/2020     Assessment & Plan:   Problem List Items Addressed This Visit       Respiratory   Asthma, moderate persistent - Primary    Stable. Albuterol refilled.      Relevant Medications   albuterol (VENTOLIN HFA) 108 (90 Base)  MCG/ACT inhaler     Endocrine   Hypothyroidism    TSH ordered.  Continue current dosing.      Relevant Orders   TSH     Other   Mixed hyperlipidemia   Relevant Medications   tranexamic acid (LYSTEDA) 650 MG TABS tablet   Other Relevant Orders   Lipid panel   Prediabetes    A1c ordered.  Continue metformin.      Relevant Orders   Hemoglobin A1c   Other Visit Diagnoses     Morbid obesity ([place])       Relevant Orders   CMP14+EGFR   History of anemia       Relevant Orders   CBC       Meds ordered this encounter  Medications   letrozole (FEMARA) 2.5 MG tablet    Sig: Take 2 tablets (5 mg total) by mouth daily for 5 days    Dispense:  10 tablet    Refill:  0   tranexamic acid (LYSTEDA) 650 MG TABS tablet    Sig: Take 2 tablets by mouth 3 times a day for 5 days.    Dispense:  30 tablet    Refill:  12   albuterol (VENTOLIN HFA) 108 (90 Base) MCG/ACT inhaler    Sig: INHALE 2 PUFFS BY MOUTH INTO LUNGS EVERY 4 HOURS FOR SHORTNESS OF BREATH OR WHEEZING    Dispense:  6.7 g    Refill:  0    Follow-up:  Annually  Thersa Salt DO [place]

## 2021-10-16 NOTE — Patient Instructions (Signed)
Continue your medications.  Labs at your convenience.  Follow up annually.  Take care  Dr. Adriana Simas

## 2021-10-17 ENCOUNTER — Other Ambulatory Visit (HOSPITAL_COMMUNITY): Payer: Self-pay

## 2021-10-24 ENCOUNTER — Other Ambulatory Visit (HOSPITAL_COMMUNITY): Payer: Self-pay

## 2021-10-26 ENCOUNTER — Other Ambulatory Visit (HOSPITAL_COMMUNITY): Payer: Self-pay

## 2021-10-26 MED ORDER — LETROZOLE 2.5 MG PO TABS
5.0000 mg | ORAL_TABLET | Freq: Every day | ORAL | 0 refills | Status: DC
  Filled 2021-10-26: qty 10, 5d supply, fill #0

## 2021-11-17 ENCOUNTER — Other Ambulatory Visit (HOSPITAL_COMMUNITY): Payer: Self-pay

## 2021-11-23 ENCOUNTER — Other Ambulatory Visit (HOSPITAL_COMMUNITY): Payer: Self-pay

## 2021-11-23 ENCOUNTER — Other Ambulatory Visit (HOSPITAL_BASED_OUTPATIENT_CLINIC_OR_DEPARTMENT_OTHER): Payer: Self-pay

## 2021-11-23 MED ORDER — TRANEXAMIC ACID 650 MG PO TABS
1300.0000 mg | ORAL_TABLET | Freq: Three times a day (TID) | ORAL | 6 refills | Status: AC
  Filled 2021-11-23 (×2): qty 30, 5d supply, fill #0
  Filled 2022-01-17: qty 30, 5d supply, fill #1
  Filled 2022-02-21 – 2022-02-24 (×2): qty 30, 5d supply, fill #2

## 2021-11-24 ENCOUNTER — Other Ambulatory Visit (HOSPITAL_COMMUNITY): Payer: Self-pay

## 2021-11-24 MED ORDER — LETROZOLE 2.5 MG PO TABS
ORAL_TABLET | ORAL | 0 refills | Status: AC
  Filled 2021-11-24: qty 10, 5d supply, fill #0

## 2021-12-08 DIAGNOSIS — I80201 Phlebitis and thrombophlebitis of unspecified deep vessels of right lower extremity: Secondary | ICD-10-CM | POA: Diagnosis not present

## 2021-12-08 DIAGNOSIS — M79661 Pain in right lower leg: Secondary | ICD-10-CM | POA: Diagnosis not present

## 2021-12-08 DIAGNOSIS — R600 Localized edema: Secondary | ICD-10-CM | POA: Diagnosis not present

## 2021-12-08 DIAGNOSIS — I83891 Varicose veins of right lower extremities with other complications: Secondary | ICD-10-CM | POA: Diagnosis not present

## 2021-12-08 DIAGNOSIS — I87393 Chronic venous hypertension (idiopathic) with other complications of bilateral lower extremity: Secondary | ICD-10-CM | POA: Diagnosis not present

## 2021-12-16 ENCOUNTER — Other Ambulatory Visit (HOSPITAL_COMMUNITY): Payer: Self-pay

## 2021-12-16 MED ORDER — WEGOVY 1 MG/0.5ML ~~LOC~~ SOAJ
1.0000 mg | SUBCUTANEOUS | 0 refills | Status: AC
  Filled 2021-12-16 – 2022-02-17 (×2): qty 2, 28d supply, fill #0

## 2021-12-17 ENCOUNTER — Other Ambulatory Visit (HOSPITAL_COMMUNITY): Payer: Self-pay

## 2021-12-19 ENCOUNTER — Other Ambulatory Visit (HOSPITAL_COMMUNITY): Payer: Self-pay

## 2022-01-08 ENCOUNTER — Other Ambulatory Visit (HOSPITAL_COMMUNITY): Payer: Self-pay

## 2022-01-13 ENCOUNTER — Other Ambulatory Visit (HOSPITAL_COMMUNITY): Payer: Self-pay

## 2022-01-15 ENCOUNTER — Other Ambulatory Visit (HOSPITAL_COMMUNITY): Payer: Self-pay

## 2022-01-16 ENCOUNTER — Other Ambulatory Visit (HOSPITAL_COMMUNITY): Payer: Self-pay

## 2022-01-18 ENCOUNTER — Other Ambulatory Visit (HOSPITAL_COMMUNITY): Payer: Self-pay

## 2022-01-18 MED ORDER — SERTRALINE HCL 100 MG PO TABS
100.0000 mg | ORAL_TABLET | Freq: Every day | ORAL | 2 refills | Status: AC
  Filled 2022-01-18: qty 90, 90d supply, fill #0

## 2022-01-19 DIAGNOSIS — I872 Venous insufficiency (chronic) (peripheral): Secondary | ICD-10-CM | POA: Diagnosis not present

## 2022-01-20 ENCOUNTER — Other Ambulatory Visit (HOSPITAL_COMMUNITY): Payer: Self-pay

## 2022-01-22 DIAGNOSIS — Z09 Encounter for follow-up examination after completed treatment for conditions other than malignant neoplasm: Secondary | ICD-10-CM | POA: Diagnosis not present

## 2022-01-22 DIAGNOSIS — I83891 Varicose veins of right lower extremities with other complications: Secondary | ICD-10-CM | POA: Diagnosis not present

## 2022-01-30 ENCOUNTER — Other Ambulatory Visit (HOSPITAL_COMMUNITY): Payer: Self-pay

## 2022-02-17 ENCOUNTER — Other Ambulatory Visit (HOSPITAL_COMMUNITY): Payer: Self-pay

## 2022-02-22 ENCOUNTER — Other Ambulatory Visit (HOSPITAL_COMMUNITY): Payer: Self-pay

## 2022-02-24 ENCOUNTER — Other Ambulatory Visit (HOSPITAL_COMMUNITY): Payer: Self-pay

## 2022-02-24 ENCOUNTER — Ambulatory Visit: Payer: Commercial Managed Care - PPO | Admitting: Orthopaedic Surgery

## 2022-02-24 DIAGNOSIS — M25562 Pain in left knee: Secondary | ICD-10-CM | POA: Diagnosis not present

## 2022-04-05 ENCOUNTER — Other Ambulatory Visit: Payer: Self-pay | Admitting: Family Medicine

## 2022-04-05 ENCOUNTER — Other Ambulatory Visit (HOSPITAL_COMMUNITY): Payer: Self-pay

## 2022-04-05 DIAGNOSIS — J4540 Moderate persistent asthma, uncomplicated: Secondary | ICD-10-CM

## 2022-04-12 ENCOUNTER — Ambulatory Visit: Payer: No Typology Code available for payment source | Admitting: Physical Therapy

## 2022-04-12 NOTE — Therapy (Signed)
Patient arrived for evaluation only to find she is out of network. She plans to begin PT within her network.  Sara Wallace MPT

## 2022-04-15 ENCOUNTER — Encounter: Payer: Self-pay | Admitting: Radiology

## 2022-07-21 ENCOUNTER — Other Ambulatory Visit: Payer: Self-pay | Admitting: Family Medicine

## 2022-07-21 DIAGNOSIS — J4540 Moderate persistent asthma, uncomplicated: Secondary | ICD-10-CM

## 2022-08-17 ENCOUNTER — Other Ambulatory Visit (HOSPITAL_COMMUNITY): Payer: Self-pay

## 2022-08-17 MED ORDER — TRANEXAMIC ACID 650 MG PO TABS
1300.0000 mg | ORAL_TABLET | Freq: Three times a day (TID) | ORAL | 0 refills | Status: AC
  Filled 2022-08-17: qty 30, 5d supply, fill #0

## 2022-08-17 MED ORDER — TRANEXAMIC ACID 650 MG PO TABS
1300.0000 mg | ORAL_TABLET | Freq: Three times a day (TID) | ORAL | 6 refills | Status: AC
  Filled 2022-08-17 – 2022-08-26 (×2): qty 30, 5d supply, fill #0

## 2022-08-26 ENCOUNTER — Other Ambulatory Visit (HOSPITAL_COMMUNITY): Payer: Self-pay

## 2022-08-26 MED ORDER — NORGESTIMATE-ETH ESTRADIOL 0.25-35 MG-MCG PO TABS
1.0000 | ORAL_TABLET | Freq: Every day | ORAL | 1 refills | Status: AC
  Filled 2022-08-26: qty 28, 21d supply, fill #0

## 2022-08-27 ENCOUNTER — Other Ambulatory Visit (HOSPITAL_COMMUNITY): Payer: Self-pay

## 2022-09-06 ENCOUNTER — Other Ambulatory Visit (HOSPITAL_COMMUNITY): Payer: Self-pay

## 2023-06-17 ENCOUNTER — Other Ambulatory Visit: Payer: Self-pay | Admitting: Family Medicine

## 2023-06-17 DIAGNOSIS — J4540 Moderate persistent asthma, uncomplicated: Secondary | ICD-10-CM

## 2023-10-07 ENCOUNTER — Encounter: Payer: Self-pay | Admitting: Radiology

## 2023-12-19 ENCOUNTER — Encounter: Payer: Self-pay | Admitting: Radiology

## 2024-02-01 ENCOUNTER — Emergency Department (HOSPITAL_BASED_OUTPATIENT_CLINIC_OR_DEPARTMENT_OTHER)
Admission: EM | Admit: 2024-02-01 | Discharge: 2024-02-01 | Disposition: A | Discharge status: Home / Self Care | Attending: Emergency Medicine | Admitting: Emergency Medicine

## 2024-02-01 DIAGNOSIS — Z7951 Long term (current) use of inhaled steroids: Secondary | ICD-10-CM | POA: Diagnosis not present

## 2024-02-01 DIAGNOSIS — R7303 Prediabetes: Secondary | ICD-10-CM | POA: Insufficient documentation

## 2024-02-01 DIAGNOSIS — R21 Rash and other nonspecific skin eruption: Secondary | ICD-10-CM | POA: Diagnosis present

## 2024-02-01 DIAGNOSIS — Z7984 Long term (current) use of oral hypoglycemic drugs: Secondary | ICD-10-CM | POA: Diagnosis not present

## 2024-02-01 DIAGNOSIS — Z79899 Other long term (current) drug therapy: Secondary | ICD-10-CM | POA: Diagnosis not present

## 2024-02-01 DIAGNOSIS — E039 Hypothyroidism, unspecified: Secondary | ICD-10-CM | POA: Insufficient documentation

## 2024-02-01 DIAGNOSIS — M7989 Other specified soft tissue disorders: Secondary | ICD-10-CM | POA: Insufficient documentation

## 2024-02-01 DIAGNOSIS — J45909 Unspecified asthma, uncomplicated: Secondary | ICD-10-CM | POA: Diagnosis not present

## 2024-02-01 DIAGNOSIS — L509 Urticaria, unspecified: Secondary | ICD-10-CM | POA: Diagnosis not present

## 2024-02-01 MED ORDER — FAMOTIDINE IN NACL 20-0.9 MG/50ML-% IV SOLN
20.0000 mg | Freq: Once | INTRAVENOUS | Status: AC
  Administered 2024-02-01: 03:00:00 20 mg via INTRAVENOUS
  Filled 2024-02-01: qty 50

## 2024-02-01 MED ORDER — DIPHENHYDRAMINE HCL 50 MG/ML IJ SOLN
12.5000 mg | Freq: Once | INTRAMUSCULAR | Status: AC
  Administered 2024-02-01: 03:00:00 12.5 mg via INTRAVENOUS
  Filled 2024-02-01: qty 1

## 2024-02-01 MED ORDER — PREDNISONE 20 MG PO TABS: 40.0000 mg | ORAL_TABLET | Freq: Every day | ORAL | 0 refills | Status: AC

## 2024-02-01 MED ORDER — CETIRIZINE HCL 10 MG PO TABS: 10.0000 mg | ORAL_TABLET | Freq: Every day | ORAL | 0 refills | Status: AC

## 2024-02-01 MED ORDER — METHYLPREDNISOLONE SODIUM SUCC 125 MG IJ SOLR
125.0000 mg | Freq: Once | INTRAMUSCULAR | Status: AC
  Administered 2024-02-01: 03:00:00 125 mg via INTRAVENOUS
  Filled 2024-02-01: qty 2

## 2024-02-01 NOTE — ED Provider Notes (Signed)
 [place] EMERGENCY DEPARTMENT AT [facility] Provider Note   CSN: [id number] Arrival date & time: 02/01/24  9950     Patient presents with: Allergic Reaction   Sara Wallace is a 33 y.o. female.   HPI     This is a 33 year old female who presents with a rash.  Patient reports onset of itchy rash on Monday.  It was mostly over her abdomen but now has moved to her face.  She feels like her upper lip is swollen.  She also reports swelling of the hands.  Denies any new soaps, lotions, detergents, medications, foods.  She has taken Benadryl  with mild relief.  Denies shortness of breath or difficulty swallowing.  Prior to Admission medications  Medication Sig Start Date End Date Taking? Authorizing Provider  cetirizine  (ZYRTEC  ALLERGY) 10 MG tablet Take 1 tablet (10 mg total) by mouth daily. 02/01/24  Yes Brenlynn Fake, Charmaine FALCON, MD  predniSONE  (DELTASONE ) 20 MG tablet Take 2 tablets (40 mg total) by mouth daily. 02/01/24  Yes Jervis Trapani, Charmaine FALCON, MD  albuterol  (VENTOLIN  HFA) 108 (90 Base) MCG/ACT inhaler INHALE 2 PUFFS INTO THE LUNGS EVERY 4 HOURS FOR SHORTNESS OF BREATH OR WHEEZING 07/21/22   Cook, Jayce G, DO  letrozole  (FEMARA ) 2.5 MG tablet Take 2 tablets (5 mg total) by mouth daily for 5 days 10/16/21   Cook, Jayce G, DO  letrozole  (FEMARA ) 2.5 MG tablet Take 2 tablets (5 mg total) by mouth daily for 5 days  on days #4-8 of cycle 11/24/21     levothyroxine  (SYNTHROID ) 200 MCG tablet Take 1 tablet by mouth in the morning on an empty stomach daily. 08/27/21     metFORMIN  (GLUCOPHAGE -XR) 500 MG 24 hr tablet Take 1 tablet (500 mg total) by mouth 2 (two) times daily. 08/11/21     norgestimate -ethinyl estradiol  (SPRINTEC  28) 0.25-35 MG-MCG tablet Take 1 tablet by mouth daily. 08/26/22     Semaglutide -Weight Management (WEGOVY ) 1 MG/0.5ML SOAJ Inject 1 mg once a week as directed. 12/16/21     sertraline  (ZOLOFT ) 100 MG tablet Take 1 tablet (100 mg total) by mouth daily. 01/18/22     sertraline   (ZOLOFT ) 50 MG tablet Take 50 mg by mouth daily.    [provider]  tranexamic acid  (LYSTEDA ) 650 MG TABS tablet Take 2 tablets by mouth 3 times a day for 5 days. 10/16/21   Cook, Jayce G, DO  tranexamic acid  (LYSTEDA ) 650 MG TABS tablet Take 2 tablets by mouth 3 times a day for 5 days. 11/23/21     tranexamic acid  (LYSTEDA ) 650 MG TABS tablet Take 2 tablets (1,300 mg total) by mouth 3 (three) times daily for 5 days. 08/17/22       Allergies: Patient has no known allergies.    Review of Systems  Constitutional:  Negative for fever.  HENT:  Positive for facial swelling. Negative for trouble swallowing.   Skin:  Positive for rash.  All other systems reviewed and are negative.   Updated Vital Signs BP 111/74 (BP Location: Right Arm)   Pulse 82   Temp 98 F (36.7 C) (Oral)   Resp 20   Wt 120.7 kg   SpO2 92%   BMI 38.17 kg/m   Physical Exam Vitals and nursing note reviewed.  Constitutional:      Appearance: She is well-developed. She is not ill-appearing.  HENT:     Head: Normocephalic and atraumatic.     Mouth/Throat:     Mouth: Mucous membranes are  moist.     Comments: Slight swelling noted of the upper lip, posterior oropharynx clear and without significant edema Eyes:     Pupils: Pupils are equal, round, and reactive to light.  Cardiovascular:     Rate and Rhythm: Normal rate and regular rhythm.     Heart sounds: Normal heart sounds.  Pulmonary:     Effort: Pulmonary effort is normal. No respiratory distress.     Breath sounds: No wheezing.  Abdominal:     Palpations: Abdomen is soft.  Musculoskeletal:     Cervical back: Neck supple.     Comments: Slight swelling noted of the bilateral hands  Skin:    General: Skin is warm and dry.     Comments: Urticaria noted over the trunk, back, hands spares lower legs and feet  Neurological:     Mental Status: She is alert and oriented to person, place, and time.  Psychiatric:        Mood and Affect: Mood normal.      (all labs ordered are listed, but only abnormal results are displayed) Labs Reviewed - No data to display  EKG: None  Radiology: No results found.   Procedures   Medications Ordered in the ED  diphenhydrAMINE  (BENADRYL ) injection 12.5 mg (12.5 mg Intravenous Given 02/01/24 0236)  famotidine  (PEPCID ) IVPB 20 mg premix (20 mg Intravenous New Bag/Given 02/01/24 0236)  methylPREDNISolone  sodium succinate (SOLU-MEDROL ) 125 mg/2 mL injection 125 mg (125 mg Intravenous Given 02/01/24 0235)                                    Medical Decision Making Risk OTC drugs. Prescription drug management.   This patient presents to the ED for concern of rash, this involves an extensive number of treatment options, and is a complaint that carries with it a high risk of complications and morbidity.  I considered the following differential and admission for this acute, potentially life threatening condition.  The differential diagnosis includes urticaria, viral urticaria, acute allergic reaction, anaphylaxis  MDM:    This is a 33 year old female who presents with an itchy rash.  She is nontoxic and vital signs are reassuring.  Rash is consistent with urticaria.  She denies any viral symptoms.  Could be allergic lead mediated.  Patient was given Solu-Medrol , Benadryl , and Pepcid .  She had defervescent's of rash while in the emergency department.  Will start on prednisone  and daily Zyrtec .  Unclear etiology.  Patient was advised of return precautions.  No signs or symptoms of anaphylaxis at this time.  (Labs, imaging, consults)  Labs: I Ordered, and personally interpreted labs.  The pertinent results include: None  Imaging Studies ordered: I ordered imaging studies including none I independently visualized and interpreted imaging. I agree with the radiologist interpretation  Additional history obtained from chart review.  External records from outside source obtained and reviewed including  prior evaluations  Cardiac Monitoring: The patient was not maintained on a cardiac monitor.  If on the cardiac monitor, I personally viewed and interpreted the cardiac monitored which showed an underlying rhythm of: N/A  Reevaluation: After the interventions noted above, I reevaluated the patient and found that they have :improved  Social Determinants of Health:  lives independently  Disposition: Discharge  Co morbidities that complicate the patient evaluation  Past Medical History:  Diagnosis Date   Anxiety    Asthma    moderate persistent  Back pain    Depression    Female infertility    Hx of varicella    Hypothyroidism    IBS (irritable bowel syndrome)    PCOS (polycystic ovarian syndrome)    Prediabetes    SVD (spontaneous vaginal delivery) 12/01/2018   Thyroid  disease      Medicines Meds ordered this encounter  Medications   diphenhydrAMINE  (BENADRYL ) injection 12.5 mg   famotidine  (PEPCID ) IVPB 20 mg premix   methylPREDNISolone  sodium succinate (SOLU-MEDROL ) 125 mg/2 mL injection 125 mg    IV methylprednisolone  will be converted to either a q12h or q24h frequency with the same total daily dose (TDD).  Ordered Dose: 1 to 125 mg TDD; convert to: TDD q24h.  Ordered Dose: 126 to 250 mg TDD; convert to: TDD div q12h.  Ordered Dose: >250 mg TDD; DAW.   predniSONE  (DELTASONE ) 20 MG tablet    Sig: Take 2 tablets (40 mg total) by mouth daily.    Dispense:  10 tablet    Refill:  0   cetirizine  (ZYRTEC  ALLERGY) 10 MG tablet    Sig: Take 1 tablet (10 mg total) by mouth daily.    Dispense:  30 tablet    Refill:  0    I have reviewed the patients home medicines and have made adjustments as needed  Problem List / ED Course: Problem List Items Addressed This Visit   None Visit Diagnoses       Hives    -  Primary                Final diagnoses:  Hives    ED Discharge Orders          Ordered    predniSONE  (DELTASONE ) 20 MG tablet  Daily         02/01/24 0330    cetirizine  (ZYRTEC  ALLERGY) 10 MG tablet  Daily        02/01/24 0330               Bari Charmaine FALCON, MD 02/01/24 [phone]

## 2024-02-01 NOTE — Discharge Instructions (Signed)
 You were seen today and noted to have hives.  This is often related to histamine release and/or allergic reaction.  Take prednisone  for the next 5 days.  Add daily Zyrtec .  You may continue Benadryl  as needed.

## 2024-02-01 NOTE — ED Triage Notes (Signed)
 Hives starting Monday night- chest shoulders. Took benadryl  Monday night and woke up no change. Has progressed to entire body, face.  Lips swelling started tonight. Tongue and throat free of irritation. Lungs clear in triage.  NKA.
